# Patient Record
Sex: Female | Born: 1970 | Race: White | Hispanic: No | Marital: Married | Smoking: Current every day smoker
Health system: Southern US, Community
[De-identification: ages and names within clinical notes are randomized; demographics above are authoritative.]

## PROBLEM LIST (undated history)

## (undated) DIAGNOSIS — K469 Unspecified abdominal hernia without obstruction or gangrene: Secondary | ICD-10-CM

## (undated) DIAGNOSIS — M1990 Unspecified osteoarthritis, unspecified site: Secondary | ICD-10-CM

## (undated) DIAGNOSIS — K8020 Calculus of gallbladder without cholecystitis without obstruction: Secondary | ICD-10-CM

## (undated) DIAGNOSIS — I10 Essential (primary) hypertension: Secondary | ICD-10-CM

## (undated) HISTORY — PX: FRACTURE SURGERY: SHX138

## (undated) HISTORY — PX: JOINT REPLACEMENT: SHX530

## (undated) HISTORY — PX: NASAL FRACTURE SURGERY: SHX718

---
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
## 1990-03-06 HISTORY — PX: KNEE SURGERY: SHX244

## 2014-06-20 ENCOUNTER — Ambulatory Visit
Admit: 2014-06-20 | Disposition: A | Discharge status: Home / Self Care | Payer: Self-pay | Attending: Family Medicine | Admitting: Family Medicine

## 2015-08-11 ENCOUNTER — Other Ambulatory Visit: Payer: Self-pay | Admitting: Pediatrics

## 2015-08-11 DIAGNOSIS — N858 Other specified noninflammatory disorders of uterus: Secondary | ICD-10-CM

## 2015-08-12 ENCOUNTER — Ambulatory Visit: Payer: Self-pay

## 2015-08-13 ENCOUNTER — Other Ambulatory Visit: Payer: Self-pay | Admitting: Pediatrics

## 2015-08-13 DIAGNOSIS — N924 Excessive bleeding in the premenopausal period: Secondary | ICD-10-CM

## 2015-08-13 DIAGNOSIS — N858 Other specified noninflammatory disorders of uterus: Secondary | ICD-10-CM

## 2015-08-16 ENCOUNTER — Ambulatory Visit
Admission: RE | Admit: 2015-08-16 | Discharge: 2015-08-16 | Disposition: A | Discharge status: Home / Self Care | Payer: 59 | Source: Ambulatory Visit | Attending: Pediatrics | Admitting: Pediatrics

## 2015-08-16 DIAGNOSIS — N924 Excessive bleeding in the premenopausal period: Secondary | ICD-10-CM | POA: Diagnosis not present

## 2015-08-16 DIAGNOSIS — N83202 Unspecified ovarian cyst, left side: Secondary | ICD-10-CM | POA: Diagnosis not present

## 2015-08-16 DIAGNOSIS — N859 Noninflammatory disorder of uterus, unspecified: Secondary | ICD-10-CM | POA: Diagnosis not present

## 2015-08-16 DIAGNOSIS — N858 Other specified noninflammatory disorders of uterus: Secondary | ICD-10-CM

## 2016-05-25 ENCOUNTER — Telehealth: Payer: Self-pay | Admitting: Obstetrics & Gynecology

## 2016-05-25 NOTE — Telephone Encounter (Signed)
Pt is being referred by [facility] for Menorrhagia alternating with Missed Menses, HX ovarian Cyst on left and left LQ Pain. Unable to reach pt due to voicemail box not being set up

## 2016-05-31 NOTE — Telephone Encounter (Signed)
Unable to reach pt due to voicemail box not being set up. 2x

## 2016-06-06 NOTE — Telephone Encounter (Signed)
Attempt to schedule pt. Pt would like to calling back once she knows her schedule for work.

## 2016-08-15 ENCOUNTER — Other Ambulatory Visit: Payer: Self-pay | Admitting: Pediatrics

## 2016-08-15 DIAGNOSIS — Z1231 Encounter for screening mammogram for malignant neoplasm of breast: Secondary | ICD-10-CM

## 2016-08-15 IMAGING — US US TRANSVAGINAL NON-OB
1 series · 13 of 25 positions shown · non-contrast
Comparison: None.

CLINICAL DATA: Menorrhagia and pelvic mass on physical exam.

EXAM:
TRANSABDOMINAL AND TRANSVAGINAL ULTRASOUND OF PELVIS
TECHNIQUE: Both transabdominal and transvaginal ultrasound examinations of the
pelvis were performed. Transabdominal technique was performed for
global imaging of the pelvis including uterus, ovaries, adnexal
regions, and pelvic cul-de-sac. It was necessary to proceed with
endovaginal exam following the transabdominal exam to visualize the
uterus, endometrium, ovaries and adnexal regions..

[Series 1: us transvaginal non-ob · 0.24mm/px · 13 of 74 slices shown]
[im 1/74]
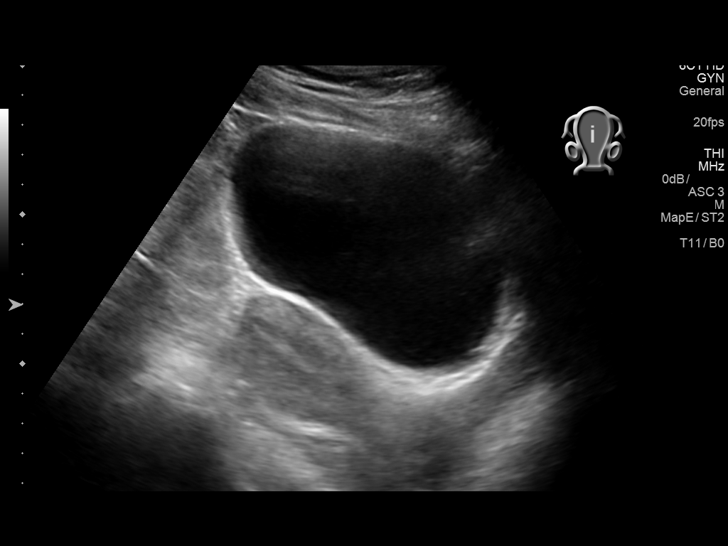
[im 7/74]
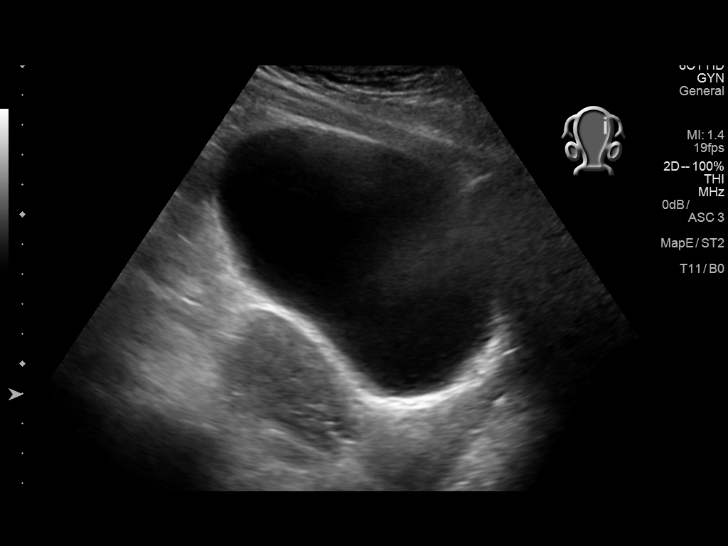
[im 13/74]
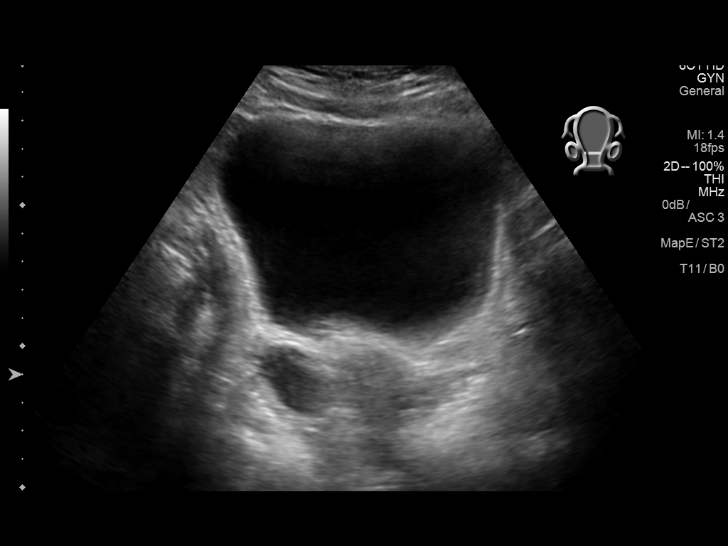
[im 19/74]
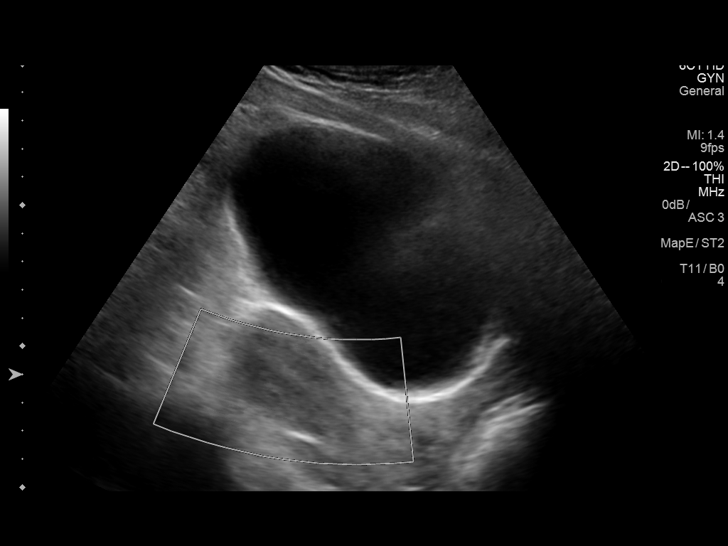
[im 25/74]
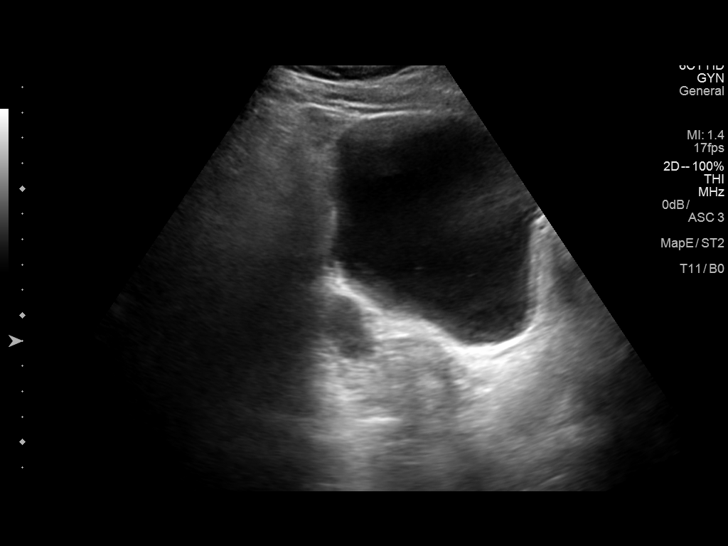
[im 31/74]
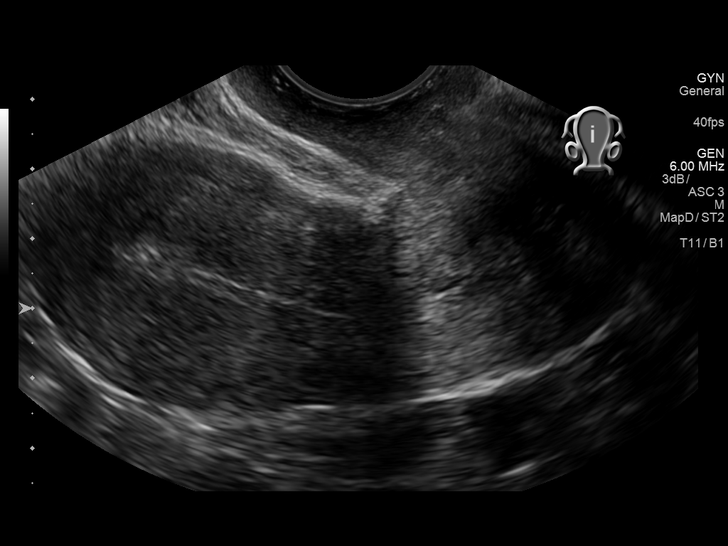
[im 37/74]
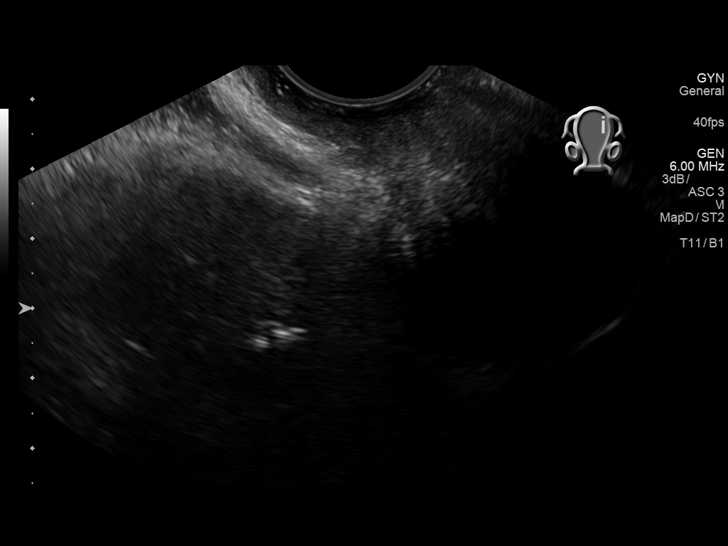
[im 43/74]
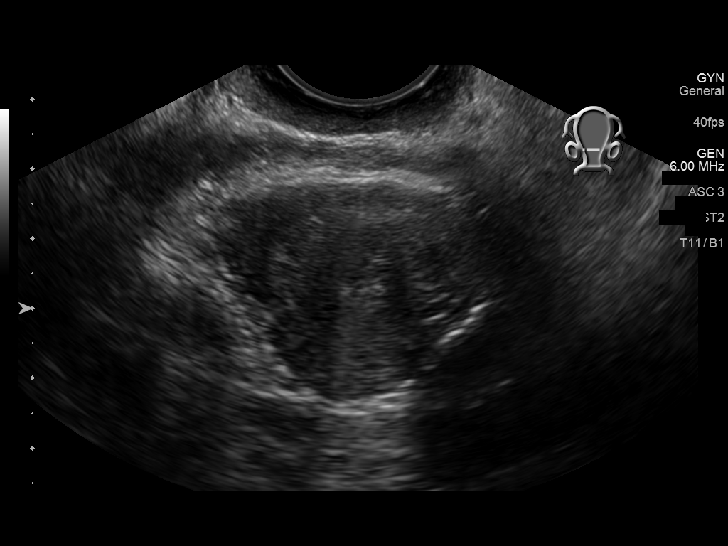
[im 49/74]
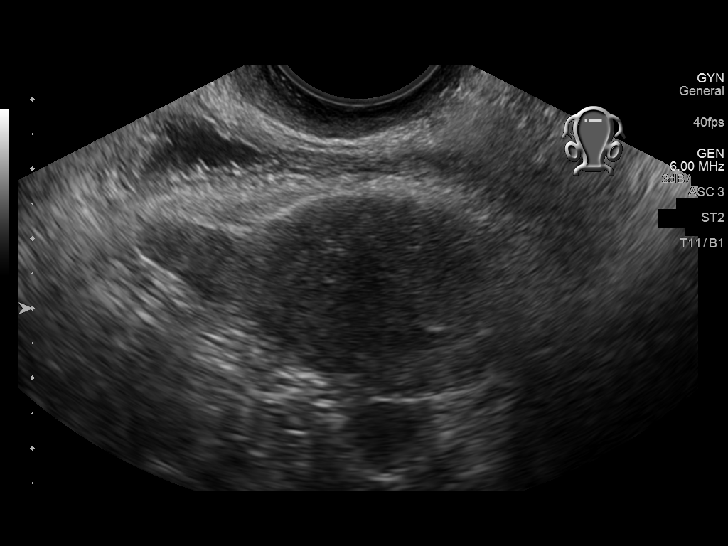
[im 55/74]
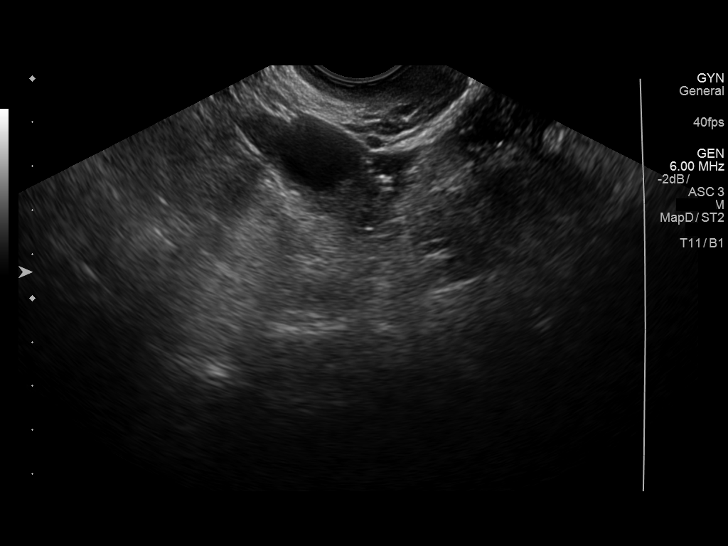
[im 61/74]
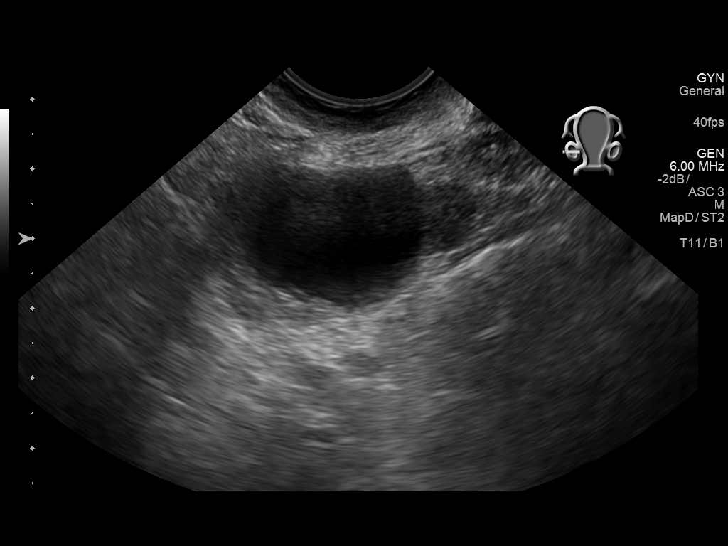
[im 67/74]
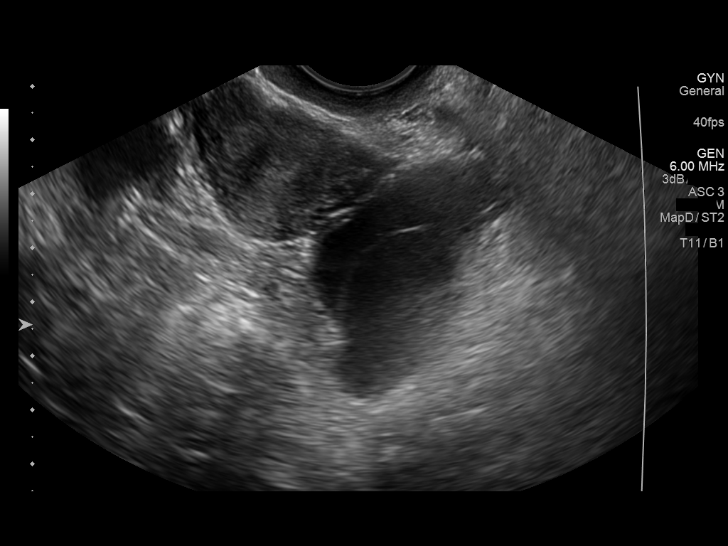
[im 74/74]
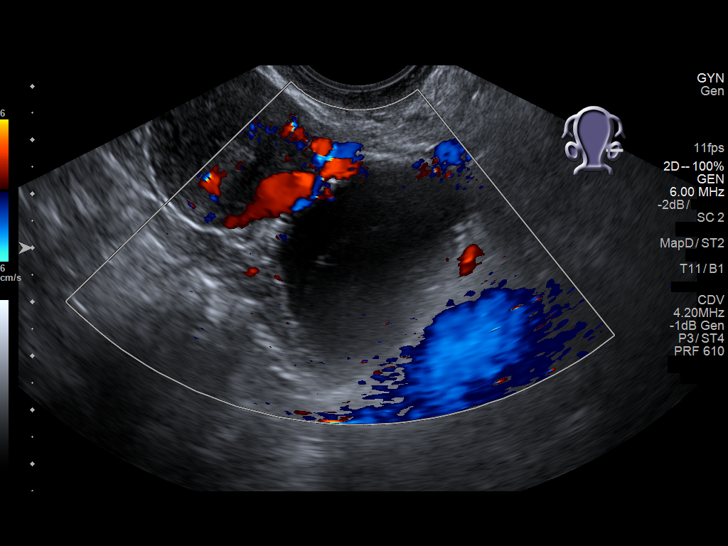

[13 of 25 positions shown; findings below may reference images not displayed]

FINDINGS: Uterus

Measurements: 8.7 x 3.8 x 4.0 cm. No fibroids or other mass
visualized.

Endometrium

Thickness: 6 mm, within normal limits. No focal abnormality
visualized.

Right ovary

Measurements: 3.5 x 2.2 x 3.6 cm. Normal appearance/no adnexal mass.

Left ovary

Measurements: 5.2 x 4.0 x 3.9 cm. An anechoic or nearly anechoic
lesion with a thin internal septation measures 4.7 x 2.9 x 2.8 cm.

Other findings

No abnormal free fluid.
IMPRESSION: Anechoic or nearly anechoic left ovarian cyst with a thin internal
septation, likely benign in a patient of this age. If further
evaluation is desired, follow-up ultrasound in 6-10 weeks could be
performed, as clinically indicated.

## 2016-08-17 ENCOUNTER — Other Ambulatory Visit: Payer: Self-pay | Admitting: Pediatrics

## 2016-08-17 ENCOUNTER — Ambulatory Visit
Admission: RE | Admit: 2016-08-17 | Discharge: 2016-08-17 | Disposition: A | Discharge status: Home / Self Care | Payer: Managed Care, Other (non HMO) | Source: Ambulatory Visit | Attending: Pediatrics | Admitting: Pediatrics

## 2016-08-17 DIAGNOSIS — Z1231 Encounter for screening mammogram for malignant neoplasm of breast: Secondary | ICD-10-CM | POA: Insufficient documentation

## 2017-02-09 ENCOUNTER — Ambulatory Visit: Payer: Self-pay | Admitting: Orthopedic Surgery

## 2017-02-13 ENCOUNTER — Encounter
Admission: RE | Admit: 2017-02-13 | Discharge: 2017-02-13 | Disposition: A | Discharge status: Home / Self Care | Payer: Managed Care, Other (non HMO) | Source: Ambulatory Visit | Attending: Orthopedic Surgery | Admitting: Orthopedic Surgery

## 2017-02-13 ENCOUNTER — Other Ambulatory Visit: Payer: Self-pay

## 2017-02-13 HISTORY — DX: Essential (primary) hypertension: I10

## 2017-02-13 HISTORY — DX: Unspecified osteoarthritis, unspecified site: M19.90

## 2017-02-13 NOTE — Patient Instructions (Addendum)
  Your procedure is scheduled on: 02-19-17 MONDAY Report to Same Day Surgery 2nd floor medical mall [facility] Entrance-take elevator on left to 2nd floor.  Check in with surgery information desk.) To find out your arrival time please call [phone] between 1PM - 3PM on 02-16-17 FRIDAY  Remember: Instructions that are not followed completely may result in serious medical risk, up to and including death, or upon the discretion of your surgeon and anesthesiologist your surgery may need to be rescheduled.    _x___ 1. Do not eat food after midnight the night before your procedure. NO GUM OR CANDY AFTER MIDNIGHT.  You may drink clear liquids up to 2 hours before you are scheduled to arrive at the hospital for your procedure.  Do not drink clear liquids within 2 hours of your scheduled arrival to the hospital.  Clear liquids include  --Water or Apple juice without pulp  --Clear carbohydrate beverage such as ClearFast or Gatorade  --Black Coffee or Clear Tea (No milk, no creamers, do not add anything to the coffee or Tea      __x__ 2. No Alcohol for 24 hours before or after surgery.   __x__3. No Smoking for 24 prior to surgery.   ____  4. Bring all medications with you on the day of surgery if instructed.    __x__ 5. Notify your doctor if there is any change in your medical condition     (cold, fever, infections).     Do not wear jewelry, make-up, hairpins, clips or nail polish.  Do not wear lotions, powders, or perfumes. You may wear deodorant.  Do not shave 48 hours prior to surgery. Men may shave face and neck.  Do not bring valuables to the hospital.    [facility] is not responsible for any belongings or valuables.               Contacts, dentures or bridgework may not be worn into surgery.  Leave your suitcase in the car. After surgery it may be brought to your room.  For patients admitted to the hospital, discharge time is determined by your treatment team.   Patients  discharged the day of surgery will not be allowed to drive home.  You will need someone to drive you home and stay with you the night of your procedure.    Please read over the following fact sheets that you were given:   [facility] Preparing for Surgery and or MRSA Information   _x___ TAKE THE FOLLOWING MEDICATION THE MORNING OF SURGERY WITH A SMALL SIP OF WATER. These include:  1. GABAPENTIN  2.  3.  4.  5.  6.  ____Fleets enema or Magnesium Citrate as directed.   _x___ Use CHG Soap or sage wipes as directed on instruction sheet   ____ Use inhalers on the day of surgery and bring to hospital day of surgery  ____ Stop Metformin and Janumet 2 days prior to surgery.    ____ Take 1/2 of usual insulin dose the night before surgery and none on the morning surgery.   ____ Follow recommendations from Cardiologist, Pulmonologist or PCP regarding stopping Aspirin, Coumadin, Plavix ,Eliquis, Effient, or Pradaxa, and Pletal.  X____Stop Anti-inflammatories such as Advil, Aleve, Ibuprofen, Motrin, Naproxen, Naprosyn, Goodies powders or aspirin products NOW-OK to take Tylenol, TRAMADOL OR HYDROCODONE IF NEEDED   _x___ Stop supplements until after surgery-STOP FISH OIL NOW-MAY RESUME AFTER SURGERY   ____ Bring C-Pap to the hospital.

## 2017-02-15 ENCOUNTER — Encounter
Admission: RE | Admit: 2017-02-15 | Discharge: 2017-02-15 | Disposition: A | Discharge status: Home / Self Care | Payer: Managed Care, Other (non HMO) | Source: Ambulatory Visit | Attending: Orthopedic Surgery | Admitting: Orthopedic Surgery

## 2017-02-15 DIAGNOSIS — Z01812 Encounter for preprocedural laboratory examination: Secondary | ICD-10-CM | POA: Insufficient documentation

## 2017-02-15 DIAGNOSIS — F17210 Nicotine dependence, cigarettes, uncomplicated: Secondary | ICD-10-CM | POA: Diagnosis not present

## 2017-02-15 DIAGNOSIS — Z01810 Encounter for preprocedural cardiovascular examination: Secondary | ICD-10-CM | POA: Diagnosis present

## 2017-02-15 DIAGNOSIS — I10 Essential (primary) hypertension: Secondary | ICD-10-CM | POA: Insufficient documentation

## 2017-02-15 LAB — POTASSIUM: Potassium: 3.8 mmol/L (ref 3.5–5.1)

## 2017-02-16 NOTE — Pre-Procedure Instructions (Signed)
Medical clearance on chart from pcp-moderate risk

## 2017-02-18 MED ORDER — CEFAZOLIN SODIUM-DEXTROSE 2-4 GM/100ML-% IV SOLN
2.0000 g | INTRAVENOUS | Status: AC
  Administered 2017-02-19: 2 g via INTRAVENOUS

## 2017-02-19 ENCOUNTER — Encounter: Payer: Self-pay | Admitting: Anesthesiology

## 2017-02-19 ENCOUNTER — Ambulatory Visit
Admission: RE | Admit: 2017-02-19 | Discharge: 2017-02-19 | Disposition: A | Discharge status: Home / Self Care | Payer: Managed Care, Other (non HMO) | Source: Ambulatory Visit | Attending: Orthopedic Surgery | Admitting: Orthopedic Surgery

## 2017-02-19 ENCOUNTER — Ambulatory Visit: Payer: Managed Care, Other (non HMO) | Admitting: Anesthesiology

## 2017-02-19 ENCOUNTER — Encounter
Admission: RE | Disposition: A | Discharge status: Home / Self Care | Payer: Self-pay | Source: Ambulatory Visit | Attending: Orthopedic Surgery

## 2017-02-19 DIAGNOSIS — Z472 Encounter for removal of internal fixation device: Secondary | ICD-10-CM | POA: Insufficient documentation

## 2017-02-19 DIAGNOSIS — I10 Essential (primary) hypertension: Secondary | ICD-10-CM | POA: Diagnosis not present

## 2017-02-19 DIAGNOSIS — M1990 Unspecified osteoarthritis, unspecified site: Secondary | ICD-10-CM | POA: Diagnosis not present

## 2017-02-19 DIAGNOSIS — Z79899 Other long term (current) drug therapy: Secondary | ICD-10-CM | POA: Insufficient documentation

## 2017-02-19 DIAGNOSIS — M79662 Pain in left lower leg: Secondary | ICD-10-CM | POA: Diagnosis not present

## 2017-02-19 DIAGNOSIS — F17200 Nicotine dependence, unspecified, uncomplicated: Secondary | ICD-10-CM | POA: Insufficient documentation

## 2017-02-19 HISTORY — PX: ORIF TIBIA PLATEAU: SHX2132

## 2017-02-19 LAB — POCT PREGNANCY, URINE: Preg Test, Ur: NEGATIVE

## 2017-02-19 SURGERY — OPEN REDUCTION INTERNAL FIXATION (ORIF) TIBIAL PLATEAU
Anesthesia: General | Site: Knee | Laterality: Left | Wound class: Clean

## 2017-02-19 MED ORDER — CHLORHEXIDINE GLUCONATE 4 % EX LIQD: 60.0000 mL | Freq: Once | CUTANEOUS | Status: DC

## 2017-02-19 MED ORDER — MIDAZOLAM HCL 2 MG/2ML IJ SOLN
INTRAMUSCULAR | Status: AC
  Filled 2017-02-19: qty 2

## 2017-02-19 MED ORDER — FAMOTIDINE 20 MG PO TABS
ORAL_TABLET | ORAL | Status: AC
  Filled 2017-02-19: qty 1

## 2017-02-19 MED ORDER — FENTANYL CITRATE (PF) 100 MCG/2ML IJ SOLN
25.0000 ug | INTRAMUSCULAR | Status: DC | PRN
Start: 2017-02-19 — End: 2017-02-19
  Administered 2017-02-19: 25 ug via INTRAVENOUS

## 2017-02-19 MED ORDER — DEXAMETHASONE SODIUM PHOSPHATE 10 MG/ML IJ SOLN
INTRAMUSCULAR | Status: AC
  Filled 2017-02-19: qty 1

## 2017-02-19 MED ORDER — BUPIVACAINE HCL (PF) 0.5 % IJ SOLN
INTRAMUSCULAR | Status: AC
  Filled 2017-02-19: qty 30

## 2017-02-19 MED ORDER — FAMOTIDINE 20 MG PO TABS
20.0000 mg | ORAL_TABLET | Freq: Once | ORAL | Status: AC
  Administered 2017-02-19: 20 mg via ORAL

## 2017-02-19 MED ORDER — HYDROMORPHONE HCL 1 MG/ML IJ SOLN
INTRAMUSCULAR | Status: DC | PRN
  Administered 2017-02-19: 1 mg via INTRAVENOUS

## 2017-02-19 MED ORDER — BUPIVACAINE-EPINEPHRINE (PF) 0.5% -1:200000 IJ SOLN
INTRAMUSCULAR | Status: AC
  Filled 2017-02-19: qty 30

## 2017-02-19 MED ORDER — ACETAMINOPHEN 500 MG PO TABS
1000.0000 mg | ORAL_TABLET | Freq: Once | ORAL | Status: AC
  Administered 2017-02-19: 1000 mg via ORAL

## 2017-02-19 MED ORDER — LACTATED RINGERS IV SOLN: INTRAVENOUS | Status: DC

## 2017-02-19 MED ORDER — ONDANSETRON HCL 4 MG/2ML IJ SOLN: 4.0000 mg | Freq: Once | INTRAMUSCULAR | Status: DC | PRN

## 2017-02-19 MED ORDER — ONDANSETRON HCL 4 MG/2ML IJ SOLN
INTRAMUSCULAR | Status: AC
  Filled 2017-02-19: qty 2

## 2017-02-19 MED ORDER — NEOMYCIN-POLYMYXIN B GU 40-200000 IR SOLN
Status: AC
  Filled 2017-02-19: qty 4

## 2017-02-19 MED ORDER — NEOMYCIN-POLYMYXIN B GU 40-200000 IR SOLN
Status: DC | PRN
  Administered 2017-02-19: 4 mL

## 2017-02-19 MED ORDER — FENTANYL CITRATE (PF) 100 MCG/2ML IJ SOLN
INTRAMUSCULAR | Status: DC | PRN
  Administered 2017-02-19: 75 ug via INTRAVENOUS
  Administered 2017-02-19: 50 ug via INTRAVENOUS
  Administered 2017-02-19: 25 ug via INTRAVENOUS
  Administered 2017-02-19: 50 ug via INTRAVENOUS

## 2017-02-19 MED ORDER — GLYCOPYRROLATE 0.2 MG/ML IJ SOLN
INTRAMUSCULAR | Status: AC
  Filled 2017-02-19: qty 1

## 2017-02-19 MED ORDER — HYDROMORPHONE HCL 1 MG/ML IJ SOLN
INTRAMUSCULAR | Status: AC
  Filled 2017-02-19: qty 1

## 2017-02-19 MED ORDER — LIDOCAINE HCL (PF) 2 % IJ SOLN
INTRAMUSCULAR | Status: AC
  Filled 2017-02-19: qty 10

## 2017-02-19 MED ORDER — PROPOFOL 10 MG/ML IV BOLUS
INTRAVENOUS | Status: DC | PRN
  Administered 2017-02-19: 50 mg via INTRAVENOUS
  Administered 2017-02-19: 150 mg via INTRAVENOUS

## 2017-02-19 MED ORDER — HYDROCODONE-ACETAMINOPHEN 5-325 MG PO TABS: 1.0000 | ORAL_TABLET | ORAL | 0 refills | Status: DC | PRN

## 2017-02-19 MED ORDER — ACETAMINOPHEN 500 MG PO TABS
ORAL_TABLET | ORAL | Status: AC
  Filled 2017-02-19: qty 2

## 2017-02-19 MED ORDER — FENTANYL CITRATE (PF) 100 MCG/2ML IJ SOLN
INTRAMUSCULAR | Status: AC
  Filled 2017-02-19: qty 2

## 2017-02-19 MED ORDER — BUPIVACAINE-EPINEPHRINE 0.5% -1:200000 IJ SOLN
INTRAMUSCULAR | Status: DC | PRN
  Administered 2017-02-19: 20 mL

## 2017-02-19 MED ORDER — GLYCOPYRROLATE 0.2 MG/ML IJ SOLN
INTRAMUSCULAR | Status: DC | PRN
  Administered 2017-02-19: 0.2 mg via INTRAVENOUS

## 2017-02-19 MED ORDER — LIDOCAINE HCL (CARDIAC) 20 MG/ML IV SOLN
INTRAVENOUS | Status: DC | PRN
  Administered 2017-02-19: 100 mg via INTRAVENOUS

## 2017-02-19 MED ORDER — PROPOFOL 10 MG/ML IV BOLUS
INTRAVENOUS | Status: AC
  Filled 2017-02-19: qty 20

## 2017-02-19 MED ORDER — DOCUSATE SODIUM 100 MG PO CAPS: 100.0000 mg | ORAL_CAPSULE | Freq: Every day | ORAL | 2 refills | Status: DC | PRN

## 2017-02-19 MED ORDER — LACTATED RINGERS IV SOLN
INTRAVENOUS | Status: DC
  Administered 2017-02-19: 12:00:00 via INTRAVENOUS

## 2017-02-19 MED ORDER — DEXAMETHASONE SODIUM PHOSPHATE 10 MG/ML IJ SOLN
INTRAMUSCULAR | Status: DC | PRN
  Administered 2017-02-19: 10 mg via INTRAVENOUS

## 2017-02-19 MED ORDER — MIDAZOLAM HCL 2 MG/2ML IJ SOLN
INTRAMUSCULAR | Status: DC | PRN
  Administered 2017-02-19: 2 mg via INTRAVENOUS

## 2017-02-19 MED ORDER — ONDANSETRON HCL 4 MG/2ML IJ SOLN
INTRAMUSCULAR | Status: DC | PRN
  Administered 2017-02-19: 4 mg via INTRAVENOUS

## 2017-02-19 SURGICAL SUPPLY — 53 items
BANDAGE ACE 6X5 VEL STRL LF (GAUZE/BANDAGES/DRESSINGS) ×3 IMPLANT
BLADE SURG SZ10 CARB STEEL (BLADE) ×3 IMPLANT
BNDG COHESIVE 4X5 TAN STRL (GAUZE/BANDAGES/DRESSINGS) ×3 IMPLANT
BNDG ESMARK 6X12 TAN STRL LF (GAUZE/BANDAGES/DRESSINGS) ×3 IMPLANT
BRUSH SCRUB EZ  4% CHG (MISCELLANEOUS) ×2
BRUSH SCRUB EZ 4% CHG (MISCELLANEOUS) ×1 IMPLANT
CANISTER SUCT 1200ML W/VALVE (MISCELLANEOUS) ×3 IMPLANT
CHLORAPREP W/TINT 26ML (MISCELLANEOUS) ×3 IMPLANT
CNTNR SPEC 2.5X3XGRAD LEK (MISCELLANEOUS) ×1
CONT SPEC 4OZ STER OR WHT (MISCELLANEOUS) ×2
CONTAINER SPEC 2.5X3XGRAD LEK (MISCELLANEOUS) ×1 IMPLANT
COOLER POLAR GLACIER W/PUMP (MISCELLANEOUS) ×3 IMPLANT
CUFF TOURN 24 STER (MISCELLANEOUS) IMPLANT
CUFF TOURN 30 STER DUAL PORT (MISCELLANEOUS) ×3 IMPLANT
DRAPE C-ARM XRAY 36X54 (DRAPES) ×3 IMPLANT
DRAPE C-ARMOR (DRAPES) ×3 IMPLANT
DRAPE INCISE IOBAN 66X45 STRL (DRAPES) ×3 IMPLANT
DRAPE TABLE BACK 80X90 (DRAPES) ×3 IMPLANT
DRAPE U-SHAPE 47X51 STRL (DRAPES) ×3 IMPLANT
DRSG AQUACEL AG ADV 3.5X10 (GAUZE/BANDAGES/DRESSINGS) ×3 IMPLANT
ELECT REM PT RETURN 9FT ADLT (ELECTROSURGICAL) ×3
ELECTRODE REM PT RTRN 9FT ADLT (ELECTROSURGICAL) ×1 IMPLANT
GAUZE PETRO XEROFOAM 1X8 (MISCELLANEOUS) ×3 IMPLANT
GAUZE SPONGE 4X4 12PLY STRL (GAUZE/BANDAGES/DRESSINGS) ×3 IMPLANT
GAUZE XEROFORM 4X4 STRL (GAUZE/BANDAGES/DRESSINGS) ×3 IMPLANT
GLOVE BIO SURGEON STRL SZ7.5 (GLOVE) ×6 IMPLANT
GLOVE INDICATOR 8.0 STRL GRN (GLOVE) ×3 IMPLANT
GLOVE SURG ORTHO 8.0 STRL STRW (GLOVE) ×3 IMPLANT
GOWN STRL REUS W/ TWL LRG LVL3 (GOWN DISPOSABLE) ×1 IMPLANT
GOWN STRL REUS W/ TWL XL LVL3 (GOWN DISPOSABLE) ×1 IMPLANT
GOWN STRL REUS W/TWL LRG LVL3 (GOWN DISPOSABLE) ×2
GOWN STRL REUS W/TWL XL LVL3 (GOWN DISPOSABLE) ×2
IMMBOLIZER KNEE 19 BLUE UNIV (SOFTGOODS) ×3 IMPLANT
KIT RM TURNOVER STRD PROC AR (KITS) ×3 IMPLANT
NS IRRIG 1000ML POUR BTL (IV SOLUTION) ×3 IMPLANT
PACK EXTREMITY ARMC (MISCELLANEOUS) ×3 IMPLANT
PAD ABD DERMACEA PRESS 5X9 (GAUZE/BANDAGES/DRESSINGS) ×3 IMPLANT
PAD CAST CTTN 4X4 STRL (SOFTGOODS) ×1 IMPLANT
PAD PREP 24X41 OB/GYN DISP (PERSONAL CARE ITEMS) ×3 IMPLANT
PAD WRAPON POLAR KNEE (MISCELLANEOUS) ×1 IMPLANT
PADDING CAST BLEND 6X4 STRL (MISCELLANEOUS) ×1 IMPLANT
PADDING CAST COTTON 4X4 STRL (SOFTGOODS) ×2
PADDING STRL CAST 6IN (MISCELLANEOUS) ×2
SPONGE LAP 18X18 5 PK (GAUZE/BANDAGES/DRESSINGS) ×3 IMPLANT
STAPLER SKIN PROX 35W (STAPLE) ×3 IMPLANT
STOCKINETTE BIAS CUT 6 980064 (GAUZE/BANDAGES/DRESSINGS) ×3 IMPLANT
STOCKINETTE IMPERVIOUS 9X36 MD (GAUZE/BANDAGES/DRESSINGS) ×3 IMPLANT
SUT VIC AB 0 CT1 36 (SUTURE) ×6 IMPLANT
SUT VIC AB 2-0 CT1 27 (SUTURE) ×4
SUT VIC AB 2-0 CT1 TAPERPNT 27 (SUTURE) ×2 IMPLANT
SUT VICRYL+ 3-0 36IN CT-1 (SUTURE) IMPLANT
SYR 5ML 18GX1 1/2 (NEEDLE) ×3 IMPLANT
WRAPON POLAR PAD KNEE (MISCELLANEOUS) ×3

## 2017-02-19 NOTE — Anesthesia Post-op Follow-up Note (Signed)
Anesthesia QCDR form completed.        

## 2017-02-19 NOTE — Op Note (Signed)
  02/19/2017  2:06 PM  PATIENT:  [name]    PRE-OPERATIVE DIAGNOSIS:  T84.84XA Pain Due to internal orthopedic prosth dev/grft, init, left tibia  POST-OPERATIVE DIAGNOSIS:  Same  PROCEDURE:  HARDWARE REMOVAL TIBIAL, DEEP, LEFT TIBIA  SURGEON:  Lovell Sheehan, MD  ANESTHESIA:   General  PREOPERATIVE INDICATIONS:  Yvonne Nelson is a  46 y.o. female with a diagnosis of T84.84XA Pain Due to internal orthopedic prosth dev/grft, init who  elected for surgical management of the fracture  The risks benefits and alternatives were discussed with the patient preoperatively including but not limited to the risks of infection, bleeding, nerve injury, cardiopulmonary complications, the need for revision surgery, among others, and the patient was willing to proceed.  EBL: less than 25 CC  TOURNIQUET TIME: 42 MIN at 250 mmHg  OPERATIVE IMPLANTS: Plate and 6 screws removed  OPERATIVE FINDINGS: lateral tibial plate with bone overgrowth and severe valgus deformity and flexion contracture of the left knee  OPERATIVE PROCEDURE:    Patient was seen in the preoperative area. I marked the operative leg according tl the hospital's correct site of surgery protocol. Patient was then brought to the operating room and was placed supine on the operative table and underwent general anesthesia with an LMA.   The operative leg was prepped and draped in a sterile fashion. A timeout performed to verify the patient's name, date of birth, medical record number, correct site of surgery correct procedure to be performed. The timeout was also used a timeout to verify patient received antibiotics and appropriate instruments, implants and radiographs studies were available in the room. Once all in attendance were in agreement case began.   Patient then had the operative extremity exsanguinated with an Esmarch. The tourniquet was placed on the upper extremity and inflated 250 mm.  The prior  incision was used and the fascia opened sharply to expose the lateral tibial plate. Using osteotomes the bony overgrowth was debrided. The screw heads were isolated and removed. The plate was elevated from the bone and passed from the field. The wound again was copiously irrigated. The deep fascia was closed with 0 Vicryl and 2-0 Vicryl for the subcutaneous tissues. The tissues were infiltrated with 1/4% marcaine with epinephrine.  The skin was closed with staples. Xeroform and a dry sterile dressing were applied along with a knee immobilizer. I was scrubbed and present for the entire case and all sharp and instrument counts were correct at the conclusion the case. The patient tolerated this procedure well and was awakened and taken to the recovery room in good condition.   Elyn Aquas. Harlow Mares, MD

## 2017-02-19 NOTE — OR Nursing (Signed)
Explanted one plate and six screws from left tibia. Per MD explanted implants to be given to patient. Consent to be obtained by MD prior to giving the explants.  Explanted implants to be given to patient per policy.

## 2017-02-19 NOTE — Transfer of Care (Signed)
Immediate Anesthesia Transfer of Care Note  Patient: Yvonne Nelson  Procedure(s) Performed: HARDWARE REMOVAL TIBIAL (Left Knee)  Patient Location: PACU  Anesthesia Type:General  Level of Consciousness: awake, alert  and oriented  Airway & Oxygen Therapy: Patient Spontanous Breathing  Post-op Assessment: Report given to RN and Post -op Vital signs reviewed and stable  Post vital signs: Reviewed and stable  Last Vitals:  Vitals:   02/19/17 1113 02/19/17 1356  BP: (!) 160/96 (!) 158/95  Pulse: 78 86  Resp: 17 12  Temp: 36.8 C 36.6 C  SpO2: 100% 100%    Last Pain:  Vitals:   02/19/17 1356  TempSrc: Tympanic         Complications: No apparent anesthesia complications

## 2017-02-19 NOTE — Discharge Instructions (Signed)
May put half of your weight on the left leg while in the knee immobilizer Apply ice and elevate, OK to remove knee immobilizer at night Keep incision clean and dry Follow-up as scheduled with Dr. Harlow Mares, call [phone] with any questions or problems such as fever, drainage or shortness of breath

## 2017-02-19 NOTE — Anesthesia Procedure Notes (Signed)
Procedure Name: LMA Insertion Date/Time: 02/19/2017 12:19 PM Performed by: Johnna Acosta, CRNA Pre-anesthesia Checklist: Patient identified, Emergency Drugs available, Suction available, Patient being monitored and Timeout performed Patient Re-evaluated:Patient Re-evaluated prior to induction Oxygen Delivery Method: Circle system utilized Preoxygenation: Pre-oxygenation with 100% oxygen Induction Type: IV induction Ventilation: Mask ventilation without difficulty LMA: LMA inserted LMA Size: 5.0 Tube type: Oral Number of attempts: 1 Placement Confirmation: positive ETCO2 and breath sounds checked- equal and bilateral Tube secured with: Tape Dental Injury: Teeth and Oropharynx as per pre-operative assessment

## 2017-02-19 NOTE — H&P (Signed)
The patient has been re-examined, and the chart reviewed, and there have been no interval changes to the documented history and physical.  Plan a left knee removal of lateral tibial plateau plate today.  Anesthesia is consulted regarding a peripheral nerve block for post-operative pain.  The risks, benefits, and alternatives have been discussed at length, and the patient is willing to proceed.

## 2017-02-19 NOTE — Anesthesia Postprocedure Evaluation (Signed)
Anesthesia Post Note  Patient: Yvonne Nelson  Procedure(s) Performed: HARDWARE REMOVAL TIBIAL (Left Knee)  Patient location during evaluation: PACU Anesthesia Type: General Level of consciousness: awake and alert Pain management: pain level controlled Vital Signs Assessment: post-procedure vital signs reviewed and stable Respiratory status: spontaneous breathing, nonlabored ventilation, respiratory function stable and patient connected to nasal cannula oxygen Cardiovascular status: blood pressure returned to baseline and stable Postop Assessment: no apparent nausea or vomiting Anesthetic complications: no     Last Vitals:  Vitals:   02/19/17 1358 02/19/17 1415  BP: (!) 158/95 (!) 169/83  Pulse: 82 84  Resp: 16 19  Temp: 36.6 C   SpO2: 100% 96%    Last Pain:  Vitals:   02/19/17 1420  TempSrc:   PainSc: [place]

## 2017-02-19 NOTE — Anesthesia Preprocedure Evaluation (Addendum)
Anesthesia Evaluation  Patient identified by MRN, date of birth, ID band Patient awake    Reviewed: Allergy & Precautions, NPO status , Patient's Chart, lab work & pertinent test results, reviewed documented beta blocker date and time   Airway Mallampati: II  TM Distance: >3 FB     Dental  (+) Chipped, Missing   Pulmonary Current Smoker,           Cardiovascular hypertension, Pt. on medications      Neuro/Psych    GI/Hepatic   Endo/Other    Renal/GU      Musculoskeletal  (+) Arthritis ,   Abdominal   Peds  Hematology   Anesthesia Other Findings   Reproductive/Obstetrics                            Anesthesia Physical Anesthesia Plan  ASA: II  Anesthesia Plan: General   Post-op Pain Management:    Induction: Intravenous  PONV Risk Score and Plan:   Airway Management Planned: LMA  Additional Equipment:   Intra-op Plan:   Post-operative Plan:   Informed Consent: I have reviewed the patients History and Physical, chart, labs and discussed the procedure including the risks, benefits and alternatives for the proposed anesthesia with the patient or authorized representative who has indicated his/her understanding and acceptance.     Plan Discussed with: CRNA  Anesthesia Plan Comments:         Anesthesia Quick Evaluation

## 2017-04-17 ENCOUNTER — Other Ambulatory Visit: Payer: Self-pay | Admitting: Orthopedic Surgery

## 2017-04-17 DIAGNOSIS — M1732 Unilateral post-traumatic osteoarthritis, left knee: Secondary | ICD-10-CM

## 2017-05-04 ENCOUNTER — Ambulatory Visit: Payer: BLUE CROSS/BLUE SHIELD

## 2017-05-11 ENCOUNTER — Ambulatory Visit
Admission: RE | Admit: 2017-05-11 | Discharge: 2017-05-11 | Disposition: A | Discharge status: Home / Self Care | Payer: BLUE CROSS/BLUE SHIELD | Source: Ambulatory Visit | Attending: Orthopedic Surgery | Admitting: Orthopedic Surgery

## 2017-05-11 DIAGNOSIS — M1732 Unilateral post-traumatic osteoarthritis, left knee: Secondary | ICD-10-CM | POA: Insufficient documentation

## 2017-05-11 DIAGNOSIS — M2342 Loose body in knee, left knee: Secondary | ICD-10-CM | POA: Diagnosis not present

## 2017-05-11 DIAGNOSIS — Z9889 Other specified postprocedural states: Secondary | ICD-10-CM | POA: Diagnosis not present

## 2017-07-13 ENCOUNTER — Ambulatory Visit: Payer: Self-pay | Admitting: Orthopedic Surgery

## 2017-08-08 ENCOUNTER — Encounter
Admission: RE | Admit: 2017-08-08 | Discharge: 2017-08-08 | Disposition: A | Discharge status: Home / Self Care | Payer: BLUE CROSS/BLUE SHIELD | Source: Ambulatory Visit | Attending: Orthopedic Surgery | Admitting: Orthopedic Surgery

## 2017-08-08 ENCOUNTER — Other Ambulatory Visit: Payer: Self-pay

## 2017-08-08 DIAGNOSIS — Z01812 Encounter for preprocedural laboratory examination: Secondary | ICD-10-CM | POA: Diagnosis not present

## 2017-08-08 HISTORY — DX: Calculus of gallbladder without cholecystitis without obstruction: K80.20

## 2017-08-08 HISTORY — DX: Unspecified abdominal hernia without obstruction or gangrene: K46.9

## 2017-08-08 LAB — URINALYSIS, ROUTINE W REFLEX MICROSCOPIC
BACTERIA UA: NONE SEEN
BILIRUBIN URINE: NEGATIVE
Glucose, UA: NEGATIVE mg/dL
Ketones, ur: 5 mg/dL — AB
Leukocytes, UA: NEGATIVE
Nitrite: NEGATIVE
Protein, ur: NEGATIVE mg/dL
Specific Gravity, Urine: 1.021 (ref 1.005–1.030)
pH: 5 (ref 5.0–8.0)

## 2017-08-08 LAB — CBC
HCT: 47.7 % — ABNORMAL HIGH (ref 35.0–47.0)
HEMOGLOBIN: 16.5 g/dL — AB (ref 12.0–16.0)
MCH: 31.9 pg (ref 26.0–34.0)
MCHC: 34.5 g/dL (ref 32.0–36.0)
MCV: 92.4 fL (ref 80.0–100.0)
PLATELETS: 245 10*3/uL (ref 150–440)
RBC: 5.16 MIL/uL (ref 3.80–5.20)
RDW: 13.4 % (ref 11.5–14.5)
WBC: 9.6 10*3/uL (ref 3.6–11.0)

## 2017-08-08 LAB — BASIC METABOLIC PANEL
ANION GAP: 13 (ref 5–15)
BUN: 9 mg/dL (ref 6–20)
CALCIUM: 9.2 mg/dL (ref 8.9–10.3)
CO2: 22 mmol/L (ref 22–32)
CREATININE: 0.6 mg/dL (ref 0.44–1.00)
Chloride: 101 mmol/L (ref 101–111)
GFR calc Af Amer: >60 mL/min (ref >60)
GFR calc non Af Amer: >60 mL/min (ref >60)
Glucose, Bld: 103 mg/dL — ABNORMAL HIGH (ref 65–99)
Potassium: 3.8 mmol/L (ref 3.5–5.1)
Sodium: 136 mmol/L (ref 135–145)

## 2017-08-08 LAB — PROTIME-INR
INR: 0.87
PROTHROMBIN TIME: 11.7 s (ref 11.4–15.2)

## 2017-08-08 LAB — TYPE AND SCREEN
ABO/RH(D): O POS
Antibody Screen: NEGATIVE

## 2017-08-08 LAB — SURGICAL PCR SCREEN
MRSA, PCR: NEGATIVE
STAPHYLOCOCCUS AUREUS: POSITIVE — AB

## 2017-08-08 LAB — APTT: APTT: 29 s (ref 24–36)

## 2017-08-08 NOTE — Pre-Procedure Instructions (Signed)
Written and verbal instructions given on use of incentive spirometer; acknowledged understanding. Patient states she still has one at home from previous surgery 6 months ago.

## 2017-08-08 NOTE — Patient Instructions (Signed)
Your procedure is scheduled on: Monday, August 20, 2017 Report to Day Surgery on the 2nd floor of the [facility]. To find out your arrival time, please call [phone] between 1PM - 3PM on: Friday, August 17, 2017  REMEMBER: Instructions that are not followed completely may result in serious medical risk, up to and including death; or upon the discretion of your surgeon and anesthesiologist your surgery may need to be rescheduled.  Do not eat food after midnight the night before your procedure.  No gum chewing, lozengers or hard candies.  You may however, drink CLEAR liquids up to 2 hours before you are scheduled to arrive for your surgery. Do not drink anything within 2 hours of the start of your surgery.  Clear liquids include: - water  - apple juice without pulp - clear gatorade - black coffee or tea (Do NOT add anything to the coffee or tea) Do NOT drink anything that is not on this list.  No Alcohol for 24 hours before or after surgery.  No Smoking including e-cigarettes for 24 hours prior to surgery.  No chewable tobacco products for at least 6 hours prior to surgery.  No nicotine patches on the day of surgery.  On the morning of surgery brush your teeth with toothpaste and water, you may rinse your mouth with mouthwash if you wish. Do not swallow any toothpaste or mouthwash.  Notify your doctor if there is any change in your medical condition (cold, fever, infection).  Do not wear jewelry, make-up, hairpins, clips or nail polish.  Do not wear lotions, powders, or perfumes. You may wear deodorant.  Do not shave 48 hours prior to surgery.   Contacts and dentures may not be worn into surgery.  Do not bring valuables to the hospital, including drivers license, insurance or credit cards.  [facility] is not responsible for any belongings or valuables.   TAKE THESE MEDICATIONS THE MORNING OF SURGERY:  1.  TRAMADOL (if needed for pain)  Use CHG Soap as directed on  instruction sheet.  On June 10 - Stop Anti-inflammatories (NSAIDS) such as Advil, Aleve, Ibuprofen, Motrin, Naproxen, Naprosyn and Aspirin based products such as Excedrin, Goodys Powder, BC Powder. (May take Tylenol or Acetaminophen if needed.)  On June 10 - Stop ANY OVER THE COUNTER supplements until after surgery. (Fish Oil) (May continue multivitamin.)  Wear comfortable clothing (specific to your surgery type) to the hospital.  Plan for stool softeners for home use.  If you are being admitted to the hospital overnight, leave your suitcase in the car. After surgery it may be brought to your room.  Please call [phone] if you have any questions about these instructions.

## 2017-08-19 MED ORDER — TRANEXAMIC ACID 1000 MG/10ML IV SOLN
1000.0000 mg | INTRAVENOUS | Status: DC
  Filled 2017-08-19: qty 10

## 2017-08-19 MED ORDER — CEFAZOLIN SODIUM-DEXTROSE 2-4 GM/100ML-% IV SOLN: 2.0000 g | INTRAVENOUS | Status: DC

## 2017-08-20 ENCOUNTER — Encounter
Admission: RE | Disposition: A | Discharge status: Home / Self Care | Payer: Self-pay | Source: Ambulatory Visit | Attending: Orthopedic Surgery

## 2017-08-20 ENCOUNTER — Encounter: Payer: Self-pay | Admitting: Anesthesiology

## 2017-08-20 ENCOUNTER — Inpatient Hospital Stay: Payer: BLUE CROSS/BLUE SHIELD | Admitting: Anesthesiology

## 2017-08-20 ENCOUNTER — Ambulatory Visit
Admission: RE | Admit: 2017-08-20 | Discharge: 2017-08-20 | Disposition: A | Discharge status: Home / Self Care | Payer: BLUE CROSS/BLUE SHIELD | Source: Ambulatory Visit | Attending: Orthopedic Surgery | Admitting: Orthopedic Surgery

## 2017-08-20 DIAGNOSIS — M1712 Unilateral primary osteoarthritis, left knee: Secondary | ICD-10-CM | POA: Diagnosis present

## 2017-08-20 DIAGNOSIS — Z539 Procedure and treatment not carried out, unspecified reason: Secondary | ICD-10-CM | POA: Diagnosis present

## 2017-08-20 LAB — ABO/RH: ABO/RH(D): O POS

## 2017-08-20 LAB — POCT PREGNANCY, URINE: Preg Test, Ur: NEGATIVE

## 2017-08-20 SURGERY — ARTHROPLASTY, KNEE, TOTAL
Anesthesia: Spinal | Laterality: Left

## 2017-08-20 MED ORDER — ACETAMINOPHEN 500 MG PO TABS: 1000.0000 mg | ORAL_TABLET | Freq: Once | ORAL | Status: DC

## 2017-08-20 MED ORDER — ACETAMINOPHEN 500 MG PO TABS
ORAL_TABLET | ORAL | Status: AC
  Filled 2017-08-20: qty 2

## 2017-08-20 MED ORDER — MIDAZOLAM HCL 2 MG/2ML IJ SOLN
INTRAMUSCULAR | Status: AC
  Filled 2017-08-20: qty 2

## 2017-08-20 MED ORDER — LIDOCAINE HCL (PF) 1 % IJ SOLN
INTRAMUSCULAR | Status: AC
  Filled 2017-08-20: qty 2

## 2017-08-20 MED ORDER — FAMOTIDINE 20 MG PO TABS
ORAL_TABLET | ORAL | Status: AC
  Administered 2017-08-20: 20 mg via ORAL
  Filled 2017-08-20: qty 1

## 2017-08-20 MED ORDER — BUPIVACAINE HCL (PF) 0.5 % IJ SOLN
INTRAMUSCULAR | Status: AC
  Filled 2017-08-20: qty 10

## 2017-08-20 MED ORDER — FENTANYL CITRATE (PF) 100 MCG/2ML IJ SOLN
INTRAMUSCULAR | Status: AC
  Filled 2017-08-20: qty 2

## 2017-08-20 MED ORDER — CHLORHEXIDINE GLUCONATE 4 % EX LIQD: 60.0000 mL | Freq: Once | CUTANEOUS | Status: DC

## 2017-08-20 MED ORDER — BUPIVACAINE LIPOSOME 1.3 % IJ SUSP
20.0000 mL | Freq: Once | INTRAMUSCULAR | Status: DC
  Filled 2017-08-20: qty 20

## 2017-08-20 MED ORDER — FAMOTIDINE 20 MG PO TABS
20.0000 mg | ORAL_TABLET | Freq: Once | ORAL | Status: AC
  Administered 2017-08-20: 20 mg via ORAL

## 2017-08-20 MED ORDER — LIDOCAINE HCL (PF) 2 % IJ SOLN
INTRAMUSCULAR | Status: AC
  Filled 2017-08-20: qty 10

## 2017-08-20 MED ORDER — PROPOFOL 500 MG/50ML IV EMUL
INTRAVENOUS | Status: AC
  Filled 2017-08-20: qty 50

## 2017-08-20 MED ORDER — LACTATED RINGERS IV SOLN: INTRAVENOUS | Status: DC

## 2017-08-20 MED ORDER — CEFAZOLIN SODIUM-DEXTROSE 2-4 GM/100ML-% IV SOLN
INTRAVENOUS | Status: AC
  Filled 2017-08-20: qty 100

## 2017-08-20 SURGICAL SUPPLY — 49 items
BLADE SAW 1 (BLADE) ×3 IMPLANT
BLADE SAW 1/2 (BLADE) ×3 IMPLANT
BLADE SAW 18WX90L 1.27 THK (BLADE) ×3 IMPLANT
BOWL CEMENT MIX W/ADAPTER (MISCELLANEOUS) ×3 IMPLANT
BRUSH SCRUB EZ  4% CHG (MISCELLANEOUS) ×4
BRUSH SCRUB EZ 4% CHG (MISCELLANEOUS) ×2 IMPLANT
CANISTER SUCT 1200ML W/VALVE (MISCELLANEOUS) ×3 IMPLANT
CANISTER SUCT 3000ML PPV (MISCELLANEOUS) ×6 IMPLANT
CHLORAPREP W/TINT 26ML (MISCELLANEOUS) ×6 IMPLANT
COOLER POLAR GLACIER W/PUMP (MISCELLANEOUS) ×3 IMPLANT
CUFF TOURN 30 STER DUAL PORT (MISCELLANEOUS) ×3 IMPLANT
DRAPE INCISE IOBAN 66X60 STRL (DRAPES) ×3 IMPLANT
DRAPE SHEET LG 3/4 BI-LAMINATE (DRAPES) ×3 IMPLANT
DRSG AQUACEL AG ADV 3.5X14 (GAUZE/BANDAGES/DRESSINGS) ×3 IMPLANT
ELECT REM PT RETURN 9FT ADLT (ELECTROSURGICAL) ×3
ELECTRODE REM PT RTRN 9FT ADLT (ELECTROSURGICAL) ×1 IMPLANT
GAUZE PETRO XEROFOAM 1X8 (MISCELLANEOUS) ×3 IMPLANT
GLOVE INDICATOR 8.0 STRL GRN (GLOVE) ×3 IMPLANT
GLOVE SURG ORTHO 8.0 STRL STRW (GLOVE) ×9 IMPLANT
GOWN STRL REUS W/ TWL LRG LVL3 (GOWN DISPOSABLE) ×1 IMPLANT
GOWN STRL REUS W/ TWL XL LVL3 (GOWN DISPOSABLE) ×1 IMPLANT
GOWN STRL REUS W/TWL LRG LVL3 (GOWN DISPOSABLE) ×2
GOWN STRL REUS W/TWL XL LVL3 (GOWN DISPOSABLE) ×2
HOOD PEEL AWAY FLYTE STAYCOOL (MISCELLANEOUS) ×9 IMPLANT
IV NS 1000ML (IV SOLUTION) ×2
IV NS 1000ML BAXH (IV SOLUTION) ×1 IMPLANT
KIT TURNOVER KIT A (KITS) ×3 IMPLANT
MAT ABSORB  FLUID 56X50 GRAY (MISCELLANEOUS) ×2
MAT ABSORB FLUID 56X50 GRAY (MISCELLANEOUS) ×1 IMPLANT
NDL SAFETY ECLIPSE 18X1.5 (NEEDLE) ×1 IMPLANT
NEEDLE HYPO 18GX1.5 SHARP (NEEDLE) ×2
NEEDLE SPNL 20GX3.5 QUINCKE YW (NEEDLE) ×3 IMPLANT
NS IRRIG 1000ML POUR BTL (IV SOLUTION) ×3 IMPLANT
PACK TOTAL KNEE (MISCELLANEOUS) ×3 IMPLANT
PAD DE MAYO PRESSURE PROTECT (MISCELLANEOUS) ×3 IMPLANT
PAD WRAPON POLAR KNEE (MISCELLANEOUS) ×1 IMPLANT
PULSAVAC PLUS IRRIG FAN TIP (DISPOSABLE) ×3
STAPLER SKIN PROX 35W (STAPLE) ×3 IMPLANT
SUCTION FRAZIER HANDLE 10FR (MISCELLANEOUS) ×2
SUCTION TUBE FRAZIER 10FR DISP (MISCELLANEOUS) ×1 IMPLANT
SUT DVC 2 QUILL PDO  T11 36X36 (SUTURE) ×2
SUT DVC 2 QUILL PDO T11 36X36 (SUTURE) ×1 IMPLANT
SUT VIC AB 2-0 CT1 18 (SUTURE) ×3 IMPLANT
SUT VIC AB 2-0 CT1 27 (SUTURE) ×2
SUT VIC AB 2-0 CT1 TAPERPNT 27 (SUTURE) ×1 IMPLANT
SUT VIC AB PLUS 45CM 1-MO-4 (SUTURE) ×3 IMPLANT
SYR 30ML LL (SYRINGE) ×9 IMPLANT
TIP FAN IRRIG PULSAVAC PLUS (DISPOSABLE) ×1 IMPLANT
WRAPON POLAR PAD KNEE (MISCELLANEOUS) ×3

## 2017-08-20 NOTE — Anesthesia Preprocedure Evaluation (Addendum)
Anesthesia Evaluation  Patient identified by MRN, date of birth, ID band Patient awake    Reviewed: Allergy & Precautions, NPO status , Patient's Chart, lab work & pertinent test results, reviewed documented beta blocker date and time   Airway Mallampati: III  TM Distance: <3 FB     Dental  (+) Chipped, Missing   Pulmonary Current Smoker,    Pulmonary exam normal        Cardiovascular hypertension, Pt. on medications Normal cardiovascular exam     Neuro/Psych negative neurological ROS  negative psych ROS   GI/Hepatic Neg liver ROS,   Endo/Other  negative endocrine ROS  Renal/GU negative Renal ROS  negative genitourinary   Musculoskeletal  (+) Arthritis ,   Abdominal Normal abdominal exam  (+)   Peds negative pediatric ROS (+)  Hematology negative hematology ROS (+)   Anesthesia Other Findings Past Medical History: No date: Arthritis     Comment:  knee  No date: Gallstones No date: Hypertension No date: Infantile hernia  Reproductive/Obstetrics                            Anesthesia Physical  Anesthesia Plan  ASA: II  Anesthesia Plan: Spinal   Post-op Pain Management:    Induction: Intravenous  PONV Risk Score and Plan:   Airway Management Planned: Nasal Cannula  Additional Equipment:   Intra-op Plan:   Post-operative Plan:   Informed Consent: I have reviewed the patients History and Physical, chart, labs and discussed the procedure including the risks, benefits and alternatives for the proposed anesthesia with the patient or authorized representative who has indicated his/her understanding and acceptance.   Dental advisory given  Plan Discussed with: CRNA and Surgeon  Anesthesia Plan Comments: (Called by preop testing nurses to try to get IV access after they have tried.  Little access points noted and peripheral sites left would not thread.  [place] used to access  compressible vein in R antecubital region successful but infiltrated secondary to not enough angiocath to stay in vein as patient straightened arm.  Will schedule patient to get a picc line and reschedule surgery.)       Anesthesia Quick Evaluation

## 2017-08-20 NOTE — H&P (Signed)
The patient has been re-examined, and the chart reviewed, and there have been no interval changes to the documented history and physical.  Plan a left total knee replacement today.  Anesthesia is consulted regarding a peripheral nerve block for post-operative pain.  The risks, benefits, and alternatives have been discussed at length, and the patient is willing to proceed.     

## 2017-08-20 NOTE — OR Nursing (Signed)
After multiple IV sticks from anesthesia and nursing. It was decided that pt. Should be scheduled for an out pt. picc line and rescheduled for surgery. Discharged to home.

## 2017-08-23 NOTE — H&P (Signed)
No H&P required. Her surgery was cancelled. Thank you.

## 2018-10-18 ENCOUNTER — Encounter: Payer: Self-pay | Admitting: Emergency Medicine

## 2018-10-18 ENCOUNTER — Emergency Department
Admission: EM | Admit: 2018-10-18 | Discharge: 2018-10-18 | Disposition: A | Discharge status: Home / Self Care | Payer: No Typology Code available for payment source | Attending: Emergency Medicine | Admitting: Emergency Medicine

## 2018-10-18 ENCOUNTER — Other Ambulatory Visit: Payer: Self-pay

## 2018-10-18 DIAGNOSIS — Z23 Encounter for immunization: Secondary | ICD-10-CM | POA: Diagnosis not present

## 2018-10-18 DIAGNOSIS — Y990 Civilian activity done for income or pay: Secondary | ICD-10-CM | POA: Insufficient documentation

## 2018-10-18 DIAGNOSIS — S60457A Superficial foreign body of left little finger, initial encounter: Secondary | ICD-10-CM | POA: Insufficient documentation

## 2018-10-18 DIAGNOSIS — W268XXA Contact with other sharp object(s), not elsewhere classified, initial encounter: Secondary | ICD-10-CM | POA: Diagnosis not present

## 2018-10-18 DIAGNOSIS — Y9389 Activity, other specified: Secondary | ICD-10-CM | POA: Insufficient documentation

## 2018-10-18 DIAGNOSIS — I10 Essential (primary) hypertension: Secondary | ICD-10-CM | POA: Diagnosis not present

## 2018-10-18 DIAGNOSIS — S60459A Superficial foreign body of unspecified finger, initial encounter: Secondary | ICD-10-CM

## 2018-10-18 DIAGNOSIS — Y929 Unspecified place or not applicable: Secondary | ICD-10-CM | POA: Diagnosis not present

## 2018-10-18 DIAGNOSIS — F17210 Nicotine dependence, cigarettes, uncomplicated: Secondary | ICD-10-CM | POA: Diagnosis not present

## 2018-10-18 MED ORDER — OXYCODONE HCL 5 MG PO TABS
5.0000 mg | ORAL_TABLET | Freq: Once | ORAL | Status: AC
  Administered 2018-10-18: 5 mg via ORAL
  Filled 2018-10-18: qty 1

## 2018-10-18 MED ORDER — SULFAMETHOXAZOLE-TRIMETHOPRIM 800-160 MG PO TABS: 1.0000 | ORAL_TABLET | Freq: Two times a day (BID) | ORAL | 0 refills | Status: DC

## 2018-10-18 MED ORDER — TETANUS-DIPHTH-ACELL PERTUSSIS 5-2.5-18.5 LF-MCG/0.5 IM SUSP
0.5000 mL | Freq: Once | INTRAMUSCULAR | Status: AC
  Administered 2018-10-18: 0.5 mL via INTRAMUSCULAR
  Filled 2018-10-18: qty 0.5

## 2018-10-18 MED ORDER — TRAMADOL HCL 50 MG PO TABS: 50.0000 mg | ORAL_TABLET | Freq: Four times a day (QID) | ORAL | 0 refills | Status: DC | PRN

## 2018-10-18 MED ORDER — LIDOCAINE HCL (PF) 1 % IJ SOLN
5.0000 mL | Freq: Once | INTRAMUSCULAR | Status: AC
  Administered 2018-10-18: 5 mL via INTRADERMAL
  Filled 2018-10-18: qty 5
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
## 2018-10-18 NOTE — Discharge Instructions (Signed)
You need to schedule an appointment with your primary care provider ASAP. Your blood pressure is high today. If you develop any symptoms of concern, return to the ER.

## 2018-10-18 NOTE — ED Triage Notes (Signed)
Presents via ems from work with staple in left 5 th finger

## 2019-02-26 IMAGING — CT CT EXTREM LOW W/O CM*L*
3 of 7 series · 11 of 33 positions shown, 13 images · non-contrast
Comparison: None.

CLINICAL DATA: Severe arthritis of the left knee. Previous proximal
tibia fractures.

EXAM:
CT OF THE LOWER LEFT EXTREMITY WITHOUT CONTRAST
TECHNIQUE: Multidetector CT imaging of the lower left extremity was performed
according to the ZIBA MATCH protocol.

[Series 11: coronal st · coronal · 0.29mm/px · 3 of 58 slices shown]
[im 12/58  bone]
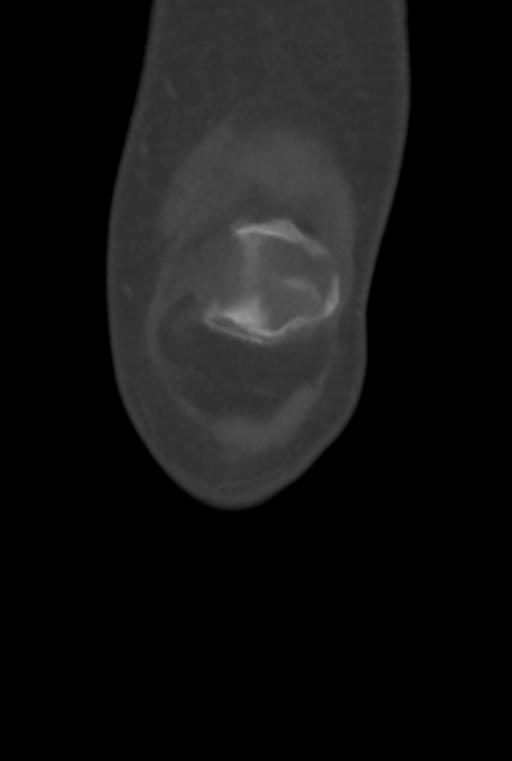
[im 23/58  bone]
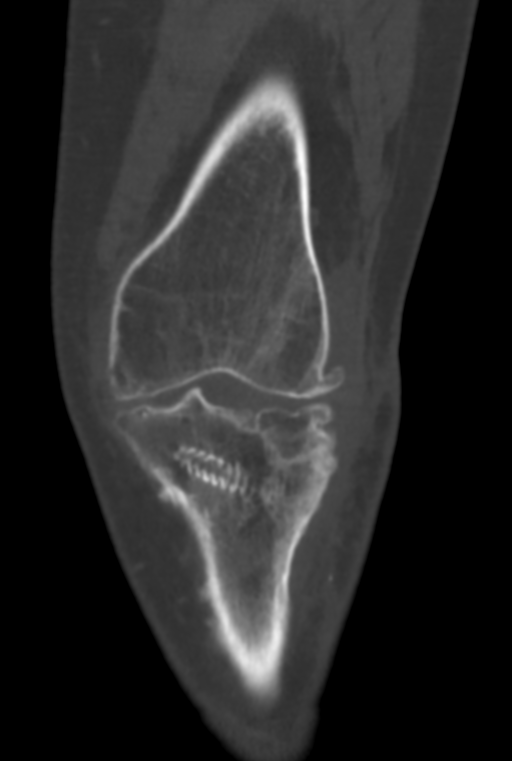
[im 35/58  bone]
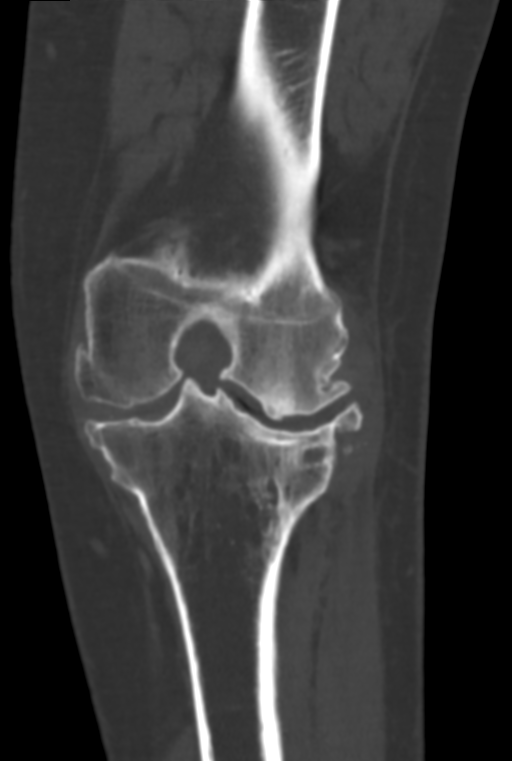

[Series 12: sagittal st · sagittal · 0.22mm/px · 5 of 58 slices shown, 6 images]
[im 20/58  bone]
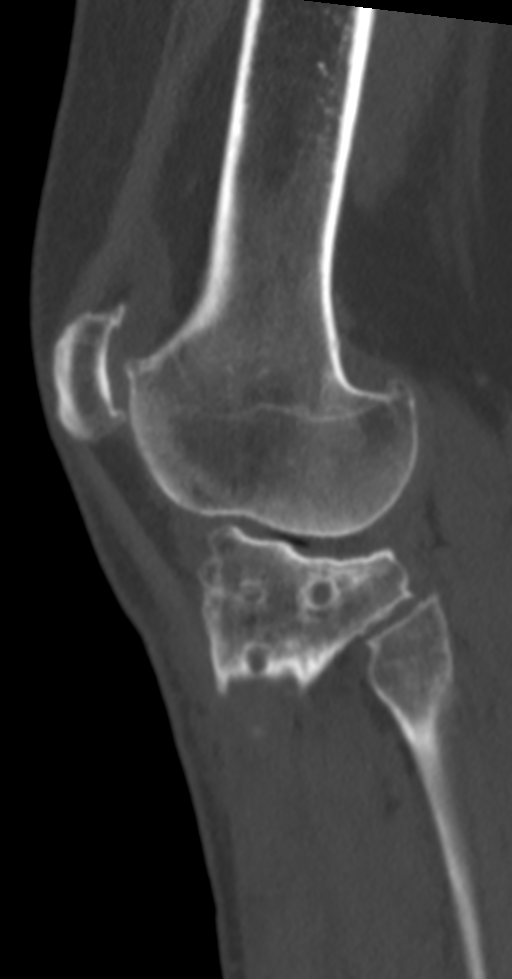
[im 24/58  bone]
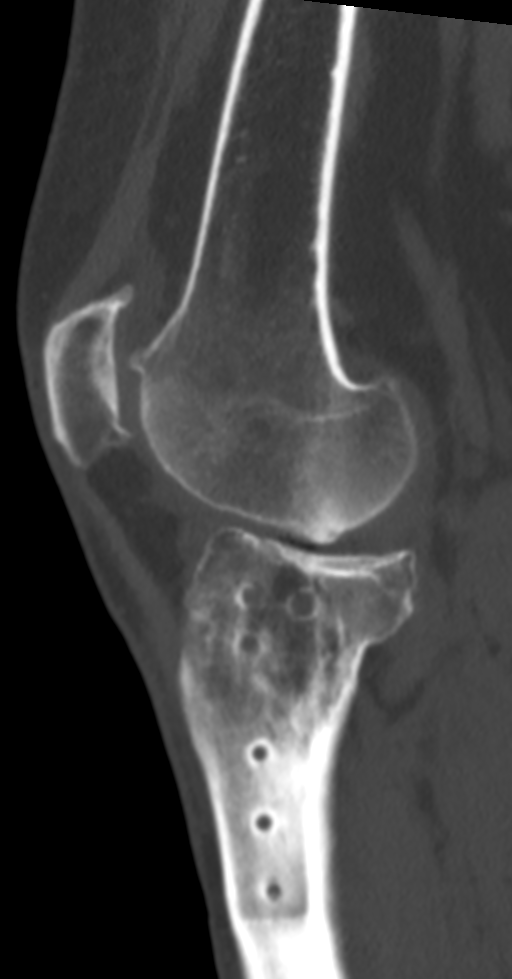
[im 29/58  soft-tissue]
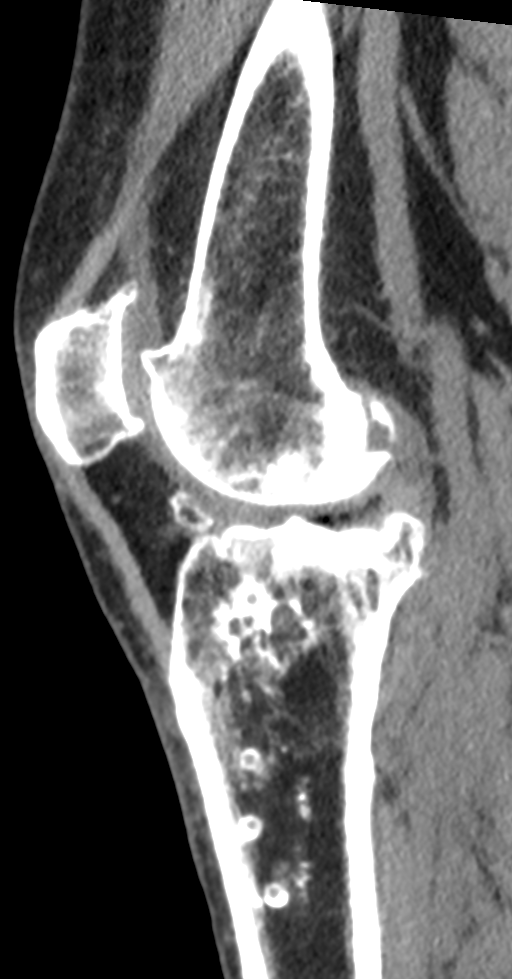
[im 29/58  bone]
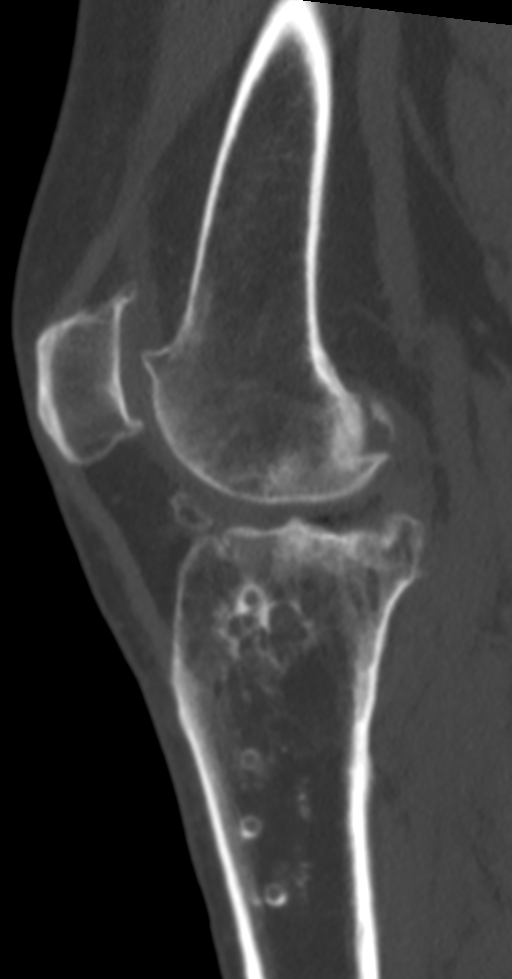
[im 34/58  bone]
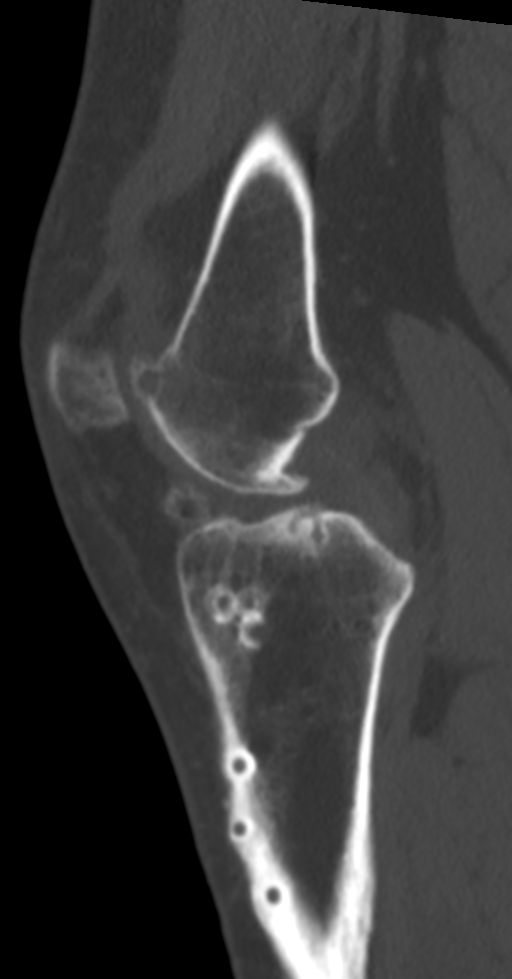
[im 39/58  bone]
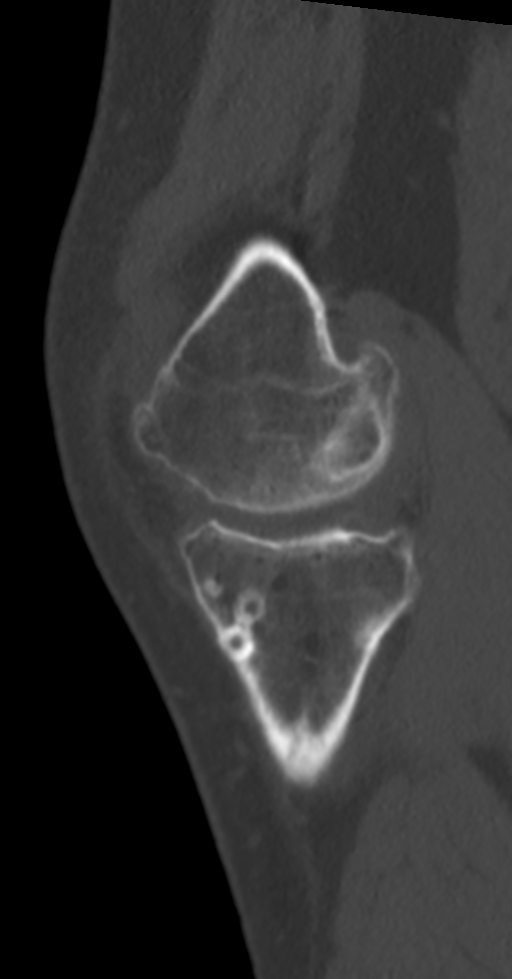

[Series 13: st thins trumatch · axial · 0.42mm/px · z∈[+351,+499]mm · 3 of 371 slices shown, 4 images]
[im 62/371  soft-tissue]
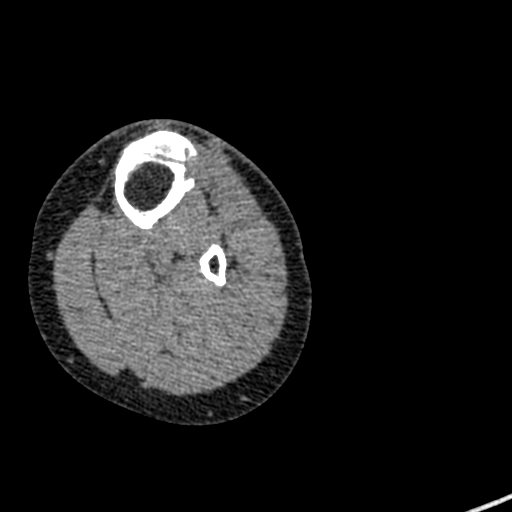
[im 62/371  bone]
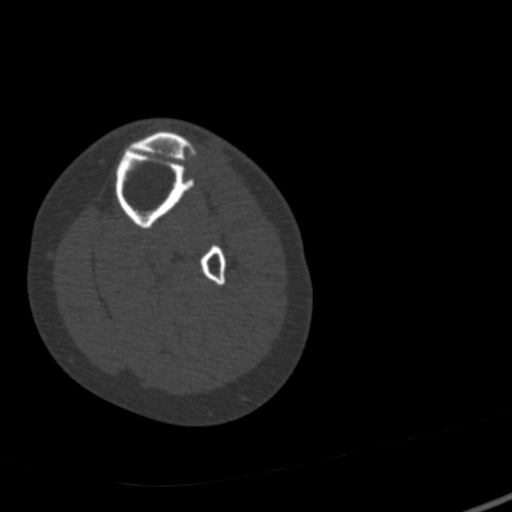
[im 186/371  bone]
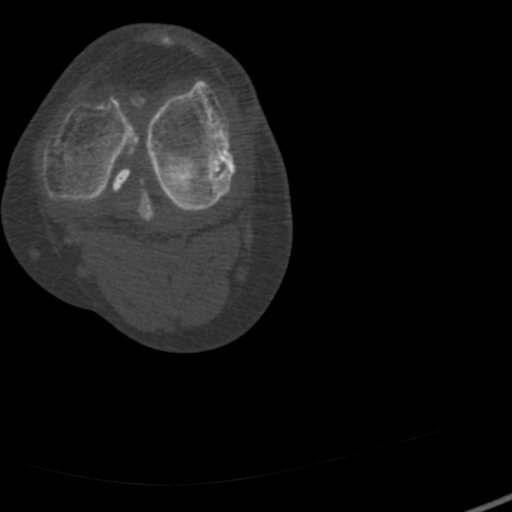
[im 309/371  bone]
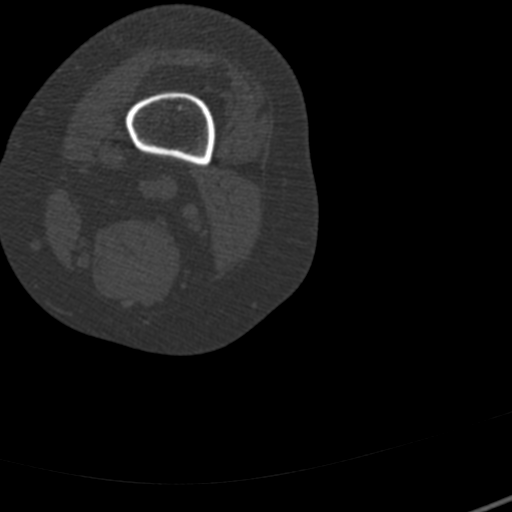

[11 of 33 positions shown; findings below may reference images not displayed]

FINDINGS: Bones/Joint/Cartilage

Axial images through the left ankle and hip appear normal.

The patient has severe arthritic changes of left knee, most
prominent in the lateral compartment. Multiple small ossified loose
bodies in the joint as well as a 16 mm loose body in Hoffa's fat
pad.

Prominent tricompartmental marginal osteophytes. Tricompartmental
joint space narrowing.

Postsurgical changes in the proximal tibia. Fixation hardware has
been removed.

Moderate joint effusion.

Ligaments

Suboptimally assessed by CT. Posterior cruciate ligament is intact.
MCL and LCL are intact. ACL is not discretely identified.

Muscles and Tendons

Normal.

Soft tissues

Normal.
IMPRESSION: Severe tricompartmental arthritis of the left knee with old
posttraumatic changes and postsurgical changes. Several loose bodies
in the joint.

## 2019-06-27 ENCOUNTER — Other Ambulatory Visit: Payer: Self-pay | Admitting: Obstetrics and Gynecology

## 2019-07-02 ENCOUNTER — Other Ambulatory Visit: Payer: Self-pay

## 2019-07-02 ENCOUNTER — Telehealth (INDEPENDENT_AMBULATORY_CARE_PROVIDER_SITE_OTHER): Payer: Self-pay

## 2019-07-02 ENCOUNTER — Encounter
Admission: RE | Admit: 2019-07-02 | Discharge: 2019-07-02 | Disposition: A | Discharge status: Home / Self Care | Payer: BLUE CROSS/BLUE SHIELD | Source: Ambulatory Visit | Attending: Obstetrics and Gynecology | Admitting: Obstetrics and Gynecology

## 2019-07-02 NOTE — Patient Instructions (Signed)
Your procedure is scheduled on: 07-14-19 MONDAY Report to Same Day Surgery 2nd floor medical mall [facility] Entrance-take elevator on left to 2nd floor.  Check in with surgery information desk.) To find out your arrival time please call [phone] between 1PM - 3PM on 07-11-19 FRIDAY  Remember: Instructions that are not followed completely may result in serious medical risk, up to and including death, or upon the discretion of your surgeon and anesthesiologist your surgery may need to be rescheduled.    _x___ 1. Do not eat food after midnight the night before your procedure. NO GUM OR CANDY AFTER MIDNIGHT. You may drink clear liquids up to 2 hours before you are scheduled to arrive at the hospital for your procedure.  Do not drink clear liquids within 2 hours of your scheduled arrival to the hospital.  Clear liquids include  --Water or Apple juice without pulp  --Gatorade  --Black Coffee or Clear Tea (No milk, no creamers, do not add anything to the coffee or Tea-ok to add sugar)   ____Ensure clear carbohydrate drink on the way to the hospital for bariatric patients  _X___Ensure clear carbohydrate drink-FINISH DRINK 2 HOURS PRIOR TO ARRIVAL [place]     __x__ 2. No Alcohol for 24 hours before or after surgery.   __x__3. No Smoking or e-cigarettes for 24 prior to surgery.  Do not use any chewable tobacco products for at least 6 hour prior to surgery   ____  4. Bring all medications with you on the day of surgery if instructed.    __x__ 5. Notify your doctor if there is any change in your medical condition     (cold, fever, infections).    x___6. On the morning of surgery brush your teeth with toothpaste and water.  You may rinse your mouth with mouth wash if you wish.  Do not swallow any toothpaste or mouthwash.   Do not wear jewelry, make-up, hairpins, clips or nail polish.  Do not wear lotions, powders, or perfumes.   Do not shave 48 hours prior to surgery. Men may  shave face and neck.  Do not bring valuables to the hospital.    [facility] is not responsible for any belongings or valuables.               Contacts, dentures or bridgework may not be worn into surgery.  Leave your suitcase in the car. After surgery it may be brought to your room.  For patients admitted to the hospital, discharge time is determined by your treatment team.  _  Patients discharged the day of surgery will not be allowed to drive home.  You will need someone to drive you home and stay with you the night of your procedure.    Please read over the following fact sheets that you were given:   [facility] Preparing for Surgery   ____ TAKE THE FOLLOWING MEDICATION THE MORNING OF SURGERY WITH A SMALL SIP OF WATER. These include:  1. NONE  2.  3.  4.  5.  6.  ____Fleets enema or Magnesium Citrate as directed.   _x___ Use CHG Soap or sage wipes as directed on instruction sheet   ____ Use inhalers on the day of surgery and bring to hospital day of surgery  ____ Stop Metformin and Janumet 2 days prior to surgery.    ____ Take 1/2 of usual insulin dose the night before surgery and none on the morning surgery.   ____  Follow recommendations from Cardiologist, Pulmonologist or PCP regarding stopping Aspirin, Coumadin, Plavix ,Eliquis, Effient, or Pradaxa, and Pletal.  X____Stop Anti-inflammatories such as Advil, Aleve, Ibuprofen, Motrin, Naproxen, Naprosyn, Goodies powders or aspirin products 7 DAYS PRIOR TO SURGERY. OK to take Tylenol OR TRAMADOL IF NEEDED   ____ Stop supplements until after surgery.    ____ Bring C-Pap to the hospital.

## 2019-07-02 NOTE — Telephone Encounter (Signed)
Spoke with the patient and she is scheduled with Dr. Lucky Cowboy for a PICC line insertion on 07/10/19 with a 10:15 am arrival time to the [facility]. Patient will do covid testing on 07/08/19 between 8-1 pm at the [place]. Pre-procedure instructions were discussed and will be mailed to the patient.

## 2019-07-02 NOTE — H&P (Signed)
Chief Complaint:   Yvonne Nelson is a 49 y.o. female presenting with Pre-op Exam   History of Present Illness: Patient presents for pre-operative visit for hysterectomy with salpingo oophorectomy for ddx hydrosalpinx or TOA. She has a history of AUB, ruptured TOA, left pelvic pain. She presented to [facility] in 05/2019 with daily left pelvic pain onset a few months prior that is worse with intercourse or standing for long periods of time. Pelvic ultrasound revealed several structures and cyst that are suspected to be hydrosalpinx or TOA - see below. Patient is worried about ovarian cancer although that is unlikely.   Workup: Pap: 05/2019 neg/neg EMBx 05/2019: benign endometrial polyp (see thickened ES below) TVUS 05/29/2019:  Ut wnl; Endometrium=9.62 mm Lt simple ov cyst= 1.66 cm RO wnl Three rt adnexal cystic/complex areas seen post to RO 1 septated cyst= 3.02 cm 2 complex = 2.70 cm 3 cystic tubular structure=4.88 x 0.94 x 0.97 cm  Pertinent Surgical Hx: 2002- TOA with rupture, spent 11 days in hospital on IV Abx per pt.  Past Medical History:  has a past medical history of Allergic rhinitis (06/04/2015), Chronic hip pain, right (06/04/2015), Chronic pain of left knee (06/04/2015), Gallstones (06/04/2015), Hypertension, and Tobacco use (06/04/2015).  Past Surgical History:  has a past surgical history that includes Hernia repair; metal rod in right thigh (1993); Bone graft left knee and screw placement x 6 (1993); and ovarian abcess (2002). Family History: family history includes Lung cancer in her maternal grandfather; No Known Problems in her paternal grandfather and paternal grandmother; Osteoporosis (Thinning of bones) in her maternal grandmother; Rheum arthritis in her maternal grandmother. Social History:  reports that she has been smoking cigarettes. She has a 15.00 pack-year smoking history. She has never used smokeless tobacco. She reports current alcohol use. She reports  that she does not use drugs. OB/GYN History:  OB History    Gravida  0   Para  0   Term  0   Preterm  0   AB  0   Living  0     SAB  0   TAB  0   Ectopic  0   Molar  0   Multiple  0   Live Births  0         Allergies: has No Known Allergies. Medications: Current Outpatient Medications:  .  hydroCHLOROthiazide (HYDRODIURIL) 25 MG tablet, Take 1 tablet (25 mg total) by mouth once daily, Disp: 90 tablet, Rfl: 3 .  MULTIVITAMIN ORAL, Take by mouth, Disp: , Rfl:  .  naproxen sodium (ALEVE, ANAPROX) 220 MG tablet, Take 220 mg by mouth 2 (two) times daily with meals., Disp: , Rfl:  .  gabapentin (NEURONTIN) 100 MG capsule, Take 3 capsules (300 mg total) by mouth 2 (two) times daily (Patient not taking: Reported on 05/21/2019  ), Disp: 180 capsule, Rfl: 11  Review of Systems: No SOB, no palpitations or chest pain, no new lower extremity edema, no nausea or vomiting or bowel or bladder complaints. See HPI for gyn specific ROS.   Exam:   BP 146/86   Ht 162.6 cm (5\' 4" )   Wt 88 kg (194 lb)   LMP 07/03/2018 (Approximate)   BMI 33.30 kg/m   General: Patient is well-groomed, well-nourished, appears stated age in no acute distress   HEENT: head is atraumatic and normocephalic, trachea is midline, neck is supple with no palpable nodules   CV: Regular rhythm and normal heart rate, no murmur  Pulm: Clear to auscultation throughout lung fields with no wheezing, crackles, or rhonchi. No increased work of breathing  Abdomen: soft , no mass, non-tender, no rebound tenderness, no hepatomegaly  Pelvic: deferred today, diffusely tender at last exam   Impression:   The primary encounter diagnosis was Preoperative clearance. A diagnosis of Pelvic pain in female was also pertinent to this visit.    Plan:    Patient returns for a preoperative discussion regarding her plans to proceed with surgical treatment of her pelvic pain, right complex adnexal cyst x1, septated right  adnexal cyst x1, cystic tubular structure in right adnexa x1, and by patient's strong request due to her worry for ovarian cancer by total laparoscopic hysterectomy with bilateral salpingo oophorectomy procedure. We may perform a cystoscopy to evaluate the urinary tract after the procedure, if surgically indicated for uro tract integrity.   The patient and I discussed the technical aspects of the procedure including the potential for risks and complications. These include but are not limited to the risk of infection requiring post-operative antibiotics or further procedures. We talked about the risk of injury to adjacent organs including bladder, bowel, ureter, blood vessels or nerves. We talked about the need to convert to an open incision. We talked about the possible need for blood transfusion. We talked aboutpostop complications such asthromboembolic or cardiopulmonary complications. All of her questions were answered.  Her preoperative exam was completed and the appropriate consents were signed. She is scheduled to undergo this procedure in the near future.  She will have a PICC placed the week before the procedure and managed by [facility] (scheduled for 07/08/19) due to poor venous access  Specific Peri-operative Considerations:  - Consent: obtained today - Health Maintenance: Patient reports weak spider veins all in her arm that have made it nearly impossible for IV's.  - Labs: CBC, CMP preoperatively - Studies: EKG, CXR preoperatively - Bowel Preparation: None required - Abx:  Ancef 2g - VTE ppx: SCDs perioperatively - Glucose Protocol: n/a - Beta-blockade: n/a    Diagnoses and all orders for this visit:  Preoperative clearance  Pelvic pain in female    Return for Postop check.  Sherrie George, MD

## 2019-07-08 ENCOUNTER — Other Ambulatory Visit: Payer: BLUE CROSS/BLUE SHIELD

## 2019-07-08 ENCOUNTER — Encounter
Admission: RE | Admit: 2019-07-08 | Discharge: 2019-07-08 | Disposition: A | Discharge status: Home / Self Care | Payer: BLUE CROSS/BLUE SHIELD | Source: Ambulatory Visit | Attending: Vascular Surgery | Admitting: Vascular Surgery

## 2019-07-08 ENCOUNTER — Other Ambulatory Visit: Payer: Self-pay

## 2019-07-08 DIAGNOSIS — Z01812 Encounter for preprocedural laboratory examination: Secondary | ICD-10-CM | POA: Diagnosis present

## 2019-07-08 DIAGNOSIS — Z20822 Contact with and (suspected) exposure to covid-19: Secondary | ICD-10-CM | POA: Insufficient documentation

## 2019-07-08 LAB — CBC
HCT: 47.5 % — ABNORMAL HIGH (ref 36.0–46.0)
Hemoglobin: 15.9 g/dL — ABNORMAL HIGH (ref 12.0–15.0)
MCH: 30.3 pg (ref 26.0–34.0)
MCHC: 33.5 g/dL (ref 30.0–36.0)
MCV: 90.5 fL (ref 80.0–100.0)
Platelets: 238 10*3/uL (ref 150–400)
RBC: 5.25 MIL/uL — ABNORMAL HIGH (ref 3.87–5.11)
RDW: 12.8 % (ref 11.5–15.5)
WBC: 7.2 10*3/uL (ref 4.0–10.5)
nRBC: 0 % (ref 0.0–0.2)

## 2019-07-08 LAB — BASIC METABOLIC PANEL
Anion gap: 9 (ref 5–15)
BUN: 12 mg/dL (ref 6–20)
CO2: 26 mmol/L (ref 22–32)
Calcium: 9.3 mg/dL (ref 8.9–10.3)
Chloride: 102 mmol/L (ref 98–111)
Creatinine, Ser: 0.54 mg/dL (ref 0.44–1.00)
GFR calc Af Amer: >60 mL/min (ref >60)
GFR calc non Af Amer: >60 mL/min (ref >60)
Glucose, Bld: 97 mg/dL (ref 70–99)
Potassium: 3.6 mmol/L (ref 3.5–5.1)
Sodium: 137 mmol/L (ref 135–145)

## 2019-07-08 LAB — SARS CORONAVIRUS 2 (TAT 6-24 HRS): SARS Coronavirus 2: NEGATIVE

## 2019-07-08 LAB — TYPE AND SCREEN
ABO/RH(D): O POS
Antibody Screen: NEGATIVE

## 2019-07-09 ENCOUNTER — Other Ambulatory Visit (INDEPENDENT_AMBULATORY_CARE_PROVIDER_SITE_OTHER): Payer: Self-pay | Admitting: Nurse Practitioner

## 2019-07-10 ENCOUNTER — Other Ambulatory Visit: Payer: Self-pay

## 2019-07-10 ENCOUNTER — Encounter: Payer: Self-pay | Admitting: Vascular Surgery

## 2019-07-10 ENCOUNTER — Encounter
Admission: RE | Disposition: A | Discharge status: Home / Self Care | Payer: Self-pay | Source: Home / Self Care | Attending: Vascular Surgery

## 2019-07-10 ENCOUNTER — Ambulatory Visit
Admission: RE | Admit: 2019-07-10 | Discharge: 2019-07-10 | Disposition: A | Discharge status: Home / Self Care | Payer: BLUE CROSS/BLUE SHIELD | Attending: Vascular Surgery | Admitting: Vascular Surgery

## 2019-07-10 ENCOUNTER — Other Ambulatory Visit: Payer: BLUE CROSS/BLUE SHIELD

## 2019-07-10 DIAGNOSIS — N7011 Chronic salpingitis: Secondary | ICD-10-CM | POA: Diagnosis not present

## 2019-07-10 DIAGNOSIS — I10 Essential (primary) hypertension: Secondary | ICD-10-CM | POA: Diagnosis not present

## 2019-07-10 DIAGNOSIS — F17210 Nicotine dependence, cigarettes, uncomplicated: Secondary | ICD-10-CM | POA: Insufficient documentation

## 2019-07-10 DIAGNOSIS — I998 Other disorder of circulatory system: Secondary | ICD-10-CM

## 2019-07-10 DIAGNOSIS — R102 Pelvic and perineal pain: Secondary | ICD-10-CM

## 2019-07-10 DIAGNOSIS — Z792 Long term (current) use of antibiotics: Secondary | ICD-10-CM

## 2019-07-10 HISTORY — PX: PICC LINE INSERTION: CATH118290

## 2019-07-10 SURGERY — PICC LINE INSERTION
Anesthesia: Moderate Sedation

## 2019-07-10 MED ORDER — HEPARIN SOD (PORK) LOCK FLUSH 100 UNIT/ML IV SOLN
INTRAVENOUS | Status: AC
  Filled 2019-07-10: qty 5

## 2019-07-10 MED ORDER — HEPARIN SODIUM (PORCINE) 10000 UNIT/ML IJ SOLN
INTRAMUSCULAR | Status: AC
  Filled 2019-07-10: qty 1

## 2019-07-10 SURGICAL SUPPLY — 2 items
CATH PICC 5FR POWER SINGLE (CATHETERS) ×3 IMPLANT
PACK ANGIOGRAPHY (CUSTOM PROCEDURE TRAY) ×3 IMPLANT

## 2019-07-10 NOTE — H&P (Signed)
[facility] History & Physical Update  The patient was interviewed and re-examined.  The patient's previous History and Physical has been reviewed and is unchanged.  There is no change in the plan of care. We plan to proceed with the scheduled procedure.  Leotis Pain, MD  07/10/2019, 10:00 AM
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
## 2019-07-10 NOTE — Op Note (Signed)
[facility]   OPERATIVE NOTE    PRE-OPERATIVE DIAGNOSIS: hydrosalpinx, pelvic pain, poor venous access  POST-OPERATIVE DIAGNOSIS: same as above  PROCEDURE: 1.  1. Ultrasound guidance for vascular access to right brachial vein 2. Fluoroscopic guidance for placement of catheter 3. Insertion of peripherally inserted central venous catheter to the right brachial vein vein  SURGEON: Leotis Pain, MD  ANESTHESIA: local  ESTIMATED BLOOD LOSS: minimal  FINDING(S): 1.  none  SPECIMEN(S):  none  INDICATIONS:   Yvonne Nelson is a 49 y.o. female who presents with pelvic pain and hydrosalpinx. We are asked to place a PICC line for long term IV access.  Risks and benefits are discussed, and informed consent was obtained.  DESCRIPTION: After obtaining full informed written consent, the patient was brought back to the vascular suite.  The patient's  right arm was sterilely prepped and draped, and a sterile surgical field was created. The right brachial vein was accessed under direct ultrasound guidance without difficulty with a micropuncture needle and a permanent image was recorded. A 0.018 wire was then placed into the superior vena cava. Peel-away sheath was placed over the wire. A peripherally inserted central venous catheter was then placed over the wire and the wire and peel-away sheath were removed. The catheter tip was placed into the superior vena cava and was secured at the skin at 34 cm with a sterile dressing. The catheter withdrew blood well and flushed easily with heparinized saline. The patient tolerated the procedure well without complications.  COMPLICATIONS: none  CONDITION: stable  Leotis Pain  07/10/2019, 12:35 PM  This note was created with Dragon Medical transcription system. Any errors in dictation are purely unintentional.

## 2019-07-14 ENCOUNTER — Ambulatory Visit
Admission: RE | Admit: 2019-07-14 | Discharge: 2019-07-14 | Disposition: A | Discharge status: Home / Self Care | Payer: BLUE CROSS/BLUE SHIELD | Attending: Obstetrics and Gynecology | Admitting: Obstetrics and Gynecology

## 2019-07-14 ENCOUNTER — Encounter
Admission: RE | Disposition: A | Discharge status: Home / Self Care | Payer: Self-pay | Source: Home / Self Care | Attending: Obstetrics and Gynecology

## 2019-07-14 ENCOUNTER — Ambulatory Visit: Payer: BLUE CROSS/BLUE SHIELD | Admitting: Registered Nurse

## 2019-07-14 ENCOUNTER — Other Ambulatory Visit: Payer: Self-pay

## 2019-07-14 ENCOUNTER — Encounter: Payer: Self-pay | Admitting: Obstetrics and Gynecology

## 2019-07-14 DIAGNOSIS — I10 Essential (primary) hypertension: Secondary | ICD-10-CM | POA: Insufficient documentation

## 2019-07-14 DIAGNOSIS — D259 Leiomyoma of uterus, unspecified: Secondary | ICD-10-CM | POA: Insufficient documentation

## 2019-07-14 DIAGNOSIS — R102 Pelvic and perineal pain: Secondary | ICD-10-CM | POA: Insufficient documentation

## 2019-07-14 DIAGNOSIS — N736 Female pelvic peritoneal adhesions (postinfective): Secondary | ICD-10-CM | POA: Diagnosis not present

## 2019-07-14 DIAGNOSIS — Z79899 Other long term (current) drug therapy: Secondary | ICD-10-CM | POA: Diagnosis not present

## 2019-07-14 DIAGNOSIS — N8311 Corpus luteum cyst of right ovary: Secondary | ICD-10-CM | POA: Diagnosis not present

## 2019-07-14 DIAGNOSIS — F17210 Nicotine dependence, cigarettes, uncomplicated: Secondary | ICD-10-CM | POA: Insufficient documentation

## 2019-07-14 DIAGNOSIS — Z791 Long term (current) use of non-steroidal anti-inflammatories (NSAID): Secondary | ICD-10-CM | POA: Diagnosis not present

## 2019-07-14 HISTORY — PX: TOTAL LAPAROSCOPIC HYSTERECTOMY WITH BILATERAL SALPINGO OOPHORECTOMY: SHX6845

## 2019-07-14 HISTORY — PX: LAPAROSCOPIC BILATERAL SALPINGO OOPHERECTOMY: SHX5890

## 2019-07-14 HISTORY — PX: CYSTOSCOPY: SHX5120

## 2019-07-14 LAB — POCT PREGNANCY, URINE: Preg Test, Ur: NEGATIVE

## 2019-07-14 SURGERY — HYSTERECTOMY, TOTAL, LAPAROSCOPIC, WITH BILATERAL SALPINGO-OOPHORECTOMY
Anesthesia: General

## 2019-07-14 MED ORDER — SEVOFLURANE IN SOLN
RESPIRATORY_TRACT | Status: AC
  Filled 2019-07-14: qty 250

## 2019-07-14 MED ORDER — ACETAMINOPHEN 500 MG PO TABS
1000.0000 mg | ORAL_TABLET | Freq: Four times a day (QID) | ORAL | 0 refills | Status: AC
Start: 2019-07-14 — End: 2019-07-17

## 2019-07-14 MED ORDER — LACTATED RINGERS IV SOLN: INTRAVENOUS | Status: DC

## 2019-07-14 MED ORDER — ONDANSETRON HCL 4 MG/2ML IJ SOLN: 4.0000 mg | Freq: Once | INTRAMUSCULAR | Status: DC | PRN

## 2019-07-14 MED ORDER — DEXAMETHASONE SODIUM PHOSPHATE 10 MG/ML IJ SOLN
INTRAMUSCULAR | Status: AC
  Filled 2019-07-14: qty 1

## 2019-07-14 MED ORDER — SUGAMMADEX SODIUM 200 MG/2ML IV SOLN
INTRAVENOUS | Status: DC | PRN
  Administered 2019-07-14: 200 mg via INTRAVENOUS

## 2019-07-14 MED ORDER — GABAPENTIN 300 MG PO CAPS
ORAL_CAPSULE | ORAL | Status: AC
  Filled 2019-07-14: qty 1

## 2019-07-14 MED ORDER — MIDAZOLAM HCL 2 MG/2ML IJ SOLN
INTRAMUSCULAR | Status: DC | PRN
  Administered 2019-07-14: 2 mg via INTRAVENOUS

## 2019-07-14 MED ORDER — DEXAMETHASONE SODIUM PHOSPHATE 10 MG/ML IJ SOLN
INTRAMUSCULAR | Status: DC | PRN
  Administered 2019-07-14: 10 mg via INTRAVENOUS

## 2019-07-14 MED ORDER — FAMOTIDINE 20 MG PO TABS
20.0000 mg | ORAL_TABLET | Freq: Once | ORAL | Status: AC
  Administered 2019-07-14: 20 mg via ORAL

## 2019-07-14 MED ORDER — GABAPENTIN 800 MG PO TABS: 800.0000 mg | ORAL_TABLET | Freq: Every day | ORAL | 0 refills | Status: AC

## 2019-07-14 MED ORDER — PHENYLEPHRINE HCL (PRESSORS) 10 MG/ML IV SOLN
INTRAVENOUS | Status: AC
  Filled 2019-07-14: qty 1

## 2019-07-14 MED ORDER — ACETAMINOPHEN 500 MG PO TABS
ORAL_TABLET | ORAL | Status: AC
  Filled 2019-07-14: qty 2

## 2019-07-14 MED ORDER — SUCCINYLCHOLINE CHLORIDE 200 MG/10ML IV SOSY
PREFILLED_SYRINGE | INTRAVENOUS | Status: AC
  Filled 2019-07-14: qty 10

## 2019-07-14 MED ORDER — SODIUM CHLORIDE 0.9 % IV SOLN
INTRAVENOUS | Status: DC | PRN
  Administered 2019-07-14: 20 ug/min via INTRAVENOUS

## 2019-07-14 MED ORDER — ROCURONIUM BROMIDE 100 MG/10ML IV SOLN
INTRAVENOUS | Status: DC | PRN
  Administered 2019-07-14: 20 mg via INTRAVENOUS
  Administered 2019-07-14: 30 mg via INTRAVENOUS
  Administered 2019-07-14: 50 mg via INTRAVENOUS
  Administered 2019-07-14: 40 mg via INTRAVENOUS

## 2019-07-14 MED ORDER — OXYCODONE HCL 5 MG PO TABS: 5.0000 mg | ORAL_TABLET | ORAL | Status: DC | PRN

## 2019-07-14 MED ORDER — SUCCINYLCHOLINE CHLORIDE 20 MG/ML IJ SOLN
INTRAMUSCULAR | Status: DC | PRN
  Administered 2019-07-14: 120 mg via INTRAVENOUS

## 2019-07-14 MED ORDER — EPHEDRINE SULFATE 50 MG/ML IJ SOLN
INTRAMUSCULAR | Status: DC | PRN
  Administered 2019-07-14: 10 mg via INTRAVENOUS
  Administered 2019-07-14: 5 mg via INTRAVENOUS

## 2019-07-14 MED ORDER — FENTANYL CITRATE (PF) 100 MCG/2ML IJ SOLN
INTRAMUSCULAR | Status: AC
  Administered 2019-07-14: 25 ug via INTRAVENOUS
  Filled 2019-07-14: qty 2

## 2019-07-14 MED ORDER — IBUPROFEN 800 MG PO TABS: 800.0000 mg | ORAL_TABLET | Freq: Three times a day (TID) | ORAL | 1 refills | Status: AC

## 2019-07-14 MED ORDER — CEFAZOLIN SODIUM-DEXTROSE 2-4 GM/100ML-% IV SOLN
INTRAVENOUS | Status: AC
  Filled 2019-07-14: qty 100

## 2019-07-14 MED ORDER — FENTANYL CITRATE (PF) 100 MCG/2ML IJ SOLN
INTRAMUSCULAR | Status: AC
  Filled 2019-07-14: qty 2

## 2019-07-14 MED ORDER — CEFAZOLIN SODIUM-DEXTROSE 2-4 GM/100ML-% IV SOLN
2.0000 g | INTRAVENOUS | Status: AC
  Administered 2019-07-14: 08:00:00 2 g via INTRAVENOUS

## 2019-07-14 MED ORDER — FAMOTIDINE 20 MG PO TABS
ORAL_TABLET | ORAL | Status: AC
  Filled 2019-07-14: qty 1

## 2019-07-14 MED ORDER — BUPIVACAINE HCL 0.5 % IJ SOLN
INTRAMUSCULAR | Status: DC | PRN
  Administered 2019-07-14: 13 mL

## 2019-07-14 MED ORDER — ROCURONIUM BROMIDE 10 MG/ML (PF) SYRINGE
PREFILLED_SYRINGE | INTRAVENOUS | Status: AC
  Filled 2019-07-14: qty 10

## 2019-07-14 MED ORDER — LIDOCAINE HCL (PF) 2 % IJ SOLN
INTRAMUSCULAR | Status: AC
  Filled 2019-07-14: qty 5

## 2019-07-14 MED ORDER — PROPOFOL 10 MG/ML IV BOLUS
INTRAVENOUS | Status: AC
  Filled 2019-07-14: qty 20

## 2019-07-14 MED ORDER — BUPIVACAINE HCL (PF) 0.5 % IJ SOLN
INTRAMUSCULAR | Status: AC
  Filled 2019-07-14: qty 30

## 2019-07-14 MED ORDER — ACETAMINOPHEN 500 MG PO TABS
1000.0000 mg | ORAL_TABLET | ORAL | Status: AC
  Administered 2019-07-14: 1000 mg via ORAL

## 2019-07-14 MED ORDER — FENTANYL CITRATE (PF) 100 MCG/2ML IJ SOLN
25.0000 ug | INTRAMUSCULAR | Status: AC | PRN
  Administered 2019-07-14 (×6): 25 ug via INTRAVENOUS

## 2019-07-14 MED ORDER — OXYCODONE HCL 5 MG PO TABS
ORAL_TABLET | ORAL | Status: AC
  Administered 2019-07-14: 5 mg via ORAL
  Filled 2019-07-14: qty 1

## 2019-07-14 MED ORDER — FENTANYL CITRATE (PF) 100 MCG/2ML IJ SOLN
INTRAMUSCULAR | Status: AC
  Administered 2019-07-14: 11:00:00 25 ug via INTRAVENOUS
  Filled 2019-07-14: qty 2

## 2019-07-14 MED ORDER — GABAPENTIN 300 MG PO CAPS
300.0000 mg | ORAL_CAPSULE | ORAL | Status: AC
  Administered 2019-07-14: 06:00:00 300 mg via ORAL

## 2019-07-14 MED ORDER — OXYCODONE HCL 5 MG PO TABS: 5.0000 mg | ORAL_TABLET | ORAL | 0 refills | Status: AC | PRN

## 2019-07-14 MED ORDER — LIDOCAINE HCL (CARDIAC) PF 100 MG/5ML IV SOSY
PREFILLED_SYRINGE | INTRAVENOUS | Status: DC | PRN
  Administered 2019-07-14: 100 mg via INTRAVENOUS

## 2019-07-14 MED ORDER — EPHEDRINE 5 MG/ML INJ
INTRAVENOUS | Status: AC
  Filled 2019-07-14: qty 10

## 2019-07-14 MED ORDER — PROPOFOL 10 MG/ML IV BOLUS
INTRAVENOUS | Status: DC | PRN
  Administered 2019-07-14: 180 mg via INTRAVENOUS

## 2019-07-14 MED ORDER — FENTANYL CITRATE (PF) 100 MCG/2ML IJ SOLN
INTRAMUSCULAR | Status: DC | PRN
  Administered 2019-07-14: 50 ug via INTRAVENOUS
  Administered 2019-07-14: 100 ug via INTRAVENOUS
  Administered 2019-07-14 (×2): 25 ug via INTRAVENOUS

## 2019-07-14 MED ORDER — ONDANSETRON HCL 4 MG/2ML IJ SOLN
INTRAMUSCULAR | Status: DC | PRN
  Administered 2019-07-14: 4 mg via INTRAVENOUS

## 2019-07-14 MED ORDER — MIDAZOLAM HCL 2 MG/2ML IJ SOLN
INTRAMUSCULAR | Status: AC
  Filled 2019-07-14: qty 2

## 2019-07-14 MED ORDER — DOCUSATE SODIUM 100 MG PO CAPS
100.0000 mg | ORAL_CAPSULE | Freq: Two times a day (BID) | ORAL | 0 refills | Status: AC
Start: 2019-07-14 — End: ?

## 2019-07-14 SURGICAL SUPPLY — 93 items
BAG URINE DRAIN 2000ML AR STRL (UROLOGICAL SUPPLIES) ×10 IMPLANT
BLADE SURG SZ11 CARB STEEL (BLADE) ×5 IMPLANT
CANISTER SUCT 1200ML W/VALVE (MISCELLANEOUS) ×5 IMPLANT
CATH FOLEY 2WAY  5CC 16FR (CATHETERS) ×2
CATH URTH 16FR FL 2W BLN LF (CATHETERS) ×3 IMPLANT
CHLORAPREP W/TINT 26 (MISCELLANEOUS) ×5 IMPLANT
CLOSURE WOUND 1/4X4 (GAUZE/BANDAGES/DRESSINGS) ×1
CORD MONOPOLAR M/FML 12FT (MISCELLANEOUS) ×5 IMPLANT
COUNTER NEEDLE 20/40 LG (NEEDLE) ×5 IMPLANT
COVER LIGHT HANDLE STERIS (MISCELLANEOUS) ×10 IMPLANT
COVER WAND RF STERILE (DRAPES) ×5 IMPLANT
DERMABOND ADVANCED (GAUZE/BANDAGES/DRESSINGS) ×2
DERMABOND ADVANCED .7 DNX12 (GAUZE/BANDAGES/DRESSINGS) ×3 IMPLANT
DEVICE SUTURE ENDOST 10MM (ENDOMECHANICALS) ×2 IMPLANT
DRAPE GENERAL ENDO 106X123.5 (DRAPES) ×5 IMPLANT
DRAPE LAP W/FLUID (DRAPES) ×5 IMPLANT
DRAPE STERI POUCH LG 24X46 STR (DRAPES) ×5 IMPLANT
DRAPE UNDER BUTTOCK W/FLU (DRAPES) ×5 IMPLANT
DRSG TEGADERM 2-3/8X2-3/4 SM (GAUZE/BANDAGES/DRESSINGS) ×15 IMPLANT
DRSG TELFA 3X8 NADH (GAUZE/BANDAGES/DRESSINGS) IMPLANT
ELECT CAUTERY BLADE 6.4 (BLADE) ×3 IMPLANT
ELECT REM PT RETURN 9FT ADLT (ELECTROSURGICAL) ×5
ELECTRODE REM PT RTRN 9FT ADLT (ELECTROSURGICAL) ×3 IMPLANT
GAUZE 4X4 16PLY RFD (DISPOSABLE) ×3 IMPLANT
GAUZE SPONGE 4X4 12PLY STRL (GAUZE/BANDAGES/DRESSINGS) ×3 IMPLANT
GLOVE BIO SURGEON STRL SZ7 (GLOVE) ×17 IMPLANT
GLOVE INDICATOR 7.5 STRL GRN (GLOVE) ×11 IMPLANT
GOWN STRL REUS W/ TWL LRG LVL3 (GOWN DISPOSABLE) ×6 IMPLANT
GOWN STRL REUS W/ TWL XL LVL3 (GOWN DISPOSABLE) ×3 IMPLANT
GOWN STRL REUS W/TWL LRG LVL3 (GOWN DISPOSABLE) ×4
GOWN STRL REUS W/TWL XL LVL3 (GOWN DISPOSABLE) ×2
GRASPER SUT TROCAR 14GX15 (MISCELLANEOUS) ×5 IMPLANT
IRRIGATION STRYKERFLOW (MISCELLANEOUS) ×3 IMPLANT
IRRIGATOR STRYKERFLOW (MISCELLANEOUS) ×5
IV NS 1000ML (IV SOLUTION) ×2
IV NS 1000ML BAXH (IV SOLUTION) ×3 IMPLANT
KIT PINK PAD W/HEAD ARE REST (MISCELLANEOUS) ×5
KIT PINK PAD W/HEAD ARM REST (MISCELLANEOUS) ×3 IMPLANT
KIT TURNOVER CYSTO (KITS) ×5 IMPLANT
L-HOOK LAP DISP 36CM (ELECTROSURGICAL) ×10
LABEL OR SOLS (LABEL) ×5 IMPLANT
LHOOK LAP DISP 36CM (ELECTROSURGICAL) IMPLANT
LIGASURE VESSEL 5MM BLUNT TIP (ELECTROSURGICAL) ×2 IMPLANT
MANIPULATOR VCARE LG CRV RETR (MISCELLANEOUS) IMPLANT
MANIPULATOR VCARE SML CRV RETR (MISCELLANEOUS) IMPLANT
MANIPULATOR VCARE STD CRV RETR (MISCELLANEOUS) ×2 IMPLANT
NDL FILTER BLUNT 18X1 1/2 (NEEDLE) ×3 IMPLANT
NEEDLE FILTER BLUNT 18X 1/2SAF (NEEDLE) ×2
NEEDLE FILTER BLUNT 18X1 1/2 (NEEDLE) ×3 IMPLANT
NS IRRIG 500ML POUR BTL (IV SOLUTION) ×5 IMPLANT
OCCLUDER COLPOPNEUMO (BALLOONS) ×5 IMPLANT
PACK BASIN MAJOR (MISCELLANEOUS) ×3 IMPLANT
PACK GYN LAPAROSCOPIC (MISCELLANEOUS) ×5 IMPLANT
PAD DRESSING TELFA 3X8 NADH (GAUZE/BANDAGES/DRESSINGS) ×3 IMPLANT
PAD OB MATERNITY 4.3X12.25 (PERSONAL CARE ITEMS) ×5 IMPLANT
PAD PREP 24X41 OB/GYN DISP (PERSONAL CARE ITEMS) ×5 IMPLANT
PENCIL ELECTRO HAND CTR (MISCELLANEOUS) ×4 IMPLANT
POUCH SPECIMEN RETRIEVAL 10MM (ENDOMECHANICALS) IMPLANT
SCISSORS METZENBAUM CVD 33 (INSTRUMENTS) ×2 IMPLANT
SET CYSTO W/LG BORE CLAMP LF (SET/KITS/TRAYS/PACK) IMPLANT
SET TUBE SMOKE EVAC HIGH FLOW (TUBING) ×5 IMPLANT
SLEEVE ENDOPATH XCEL 5M (ENDOMECHANICALS) ×5 IMPLANT
SOL PREP PVP 2OZ (MISCELLANEOUS) ×5
SOLUTION PREP PVP 2OZ (MISCELLANEOUS) ×3 IMPLANT
SPONGE GAUZE 2X2 8PLY STER LF (GAUZE/BANDAGES/DRESSINGS) ×2
SPONGE GAUZE 2X2 8PLY STRL LF (GAUZE/BANDAGES/DRESSINGS) ×8 IMPLANT
STAPLER SKIN PROX 35W (STAPLE) ×3 IMPLANT
STRIP CLOSURE SKIN 1/4X4 (GAUZE/BANDAGES/DRESSINGS) ×4 IMPLANT
SURGILUBE 2OZ TUBE FLIPTOP (MISCELLANEOUS) ×5 IMPLANT
SUT ENDO VLOC 180-0-8IN (SUTURE) IMPLANT
SUT MNCRL 4-0 (SUTURE) ×2
SUT MNCRL 4-0 27XMFL (SUTURE) ×3
SUT MNCRL AB 4-0 PS2 18 (SUTURE) ×3 IMPLANT
SUT VIC AB 0 CT1 27 (SUTURE)
SUT VIC AB 0 CT1 27XCR 8 STRN (SUTURE) ×9 IMPLANT
SUT VIC AB 0 CT1 36 (SUTURE) ×8 IMPLANT
SUT VIC AB 2-0 SH 27 (SUTURE)
SUT VIC AB 2-0 SH 27XBRD (SUTURE) ×12 IMPLANT
SUT VIC AB 2-0 UR6 27 (SUTURE) ×3 IMPLANT
SUT VIC AB 4-0 SH 27 (SUTURE)
SUT VIC AB 4-0 SH 27XANBCTRL (SUTURE) ×3 IMPLANT
SUT VICRYL PLUS ABS 0 54 (SUTURE) ×3 IMPLANT
SUTURE MNCRL 4-0 27XMF (SUTURE) ×3 IMPLANT
SYR 10ML LL (SYRINGE) ×5 IMPLANT
SYR 50ML LL SCALE MARK (SYRINGE) ×5 IMPLANT
SYR 5ML LL (SYRINGE) ×5 IMPLANT
SYR BULB IRRIG 60ML STRL (SYRINGE) ×3 IMPLANT
TRAY FOLEY MTR SLVR 16FR STAT (SET/KITS/TRAYS/PACK) ×3 IMPLANT
TROCAR ENDO BLADELESS 11MM (ENDOMECHANICALS) ×5 IMPLANT
TROCAR XCEL NON-BLD 5MMX100MML (ENDOMECHANICALS) ×5 IMPLANT
TUBING ART PRESS 48 MALE/FEM (TUBING) IMPLANT
TUBING EVAC SMOKE HEATED PNEUM (TUBING) ×5 IMPLANT
WATER STERILE IRR 1000ML POUR (IV SOLUTION) ×3 IMPLANT

## 2019-07-14 NOTE — Transfer of Care (Signed)
Immediate Anesthesia Transfer of Care Note  Patient: Yvonne Nelson  Procedure(s) Performed: HYSTERECTOMY TOTAL LAPAROSCOPIC,BILATERAL SALPINGO OOPHERECTOMY (N/A ) LAPAROSCOPIC BILATERAL SALPINGO OOPHORECTOMY (Bilateral ) CYSTOSCOPY  Patient Location: PACU  Anesthesia Type:General  Level of Consciousness: sedated  Airway & Oxygen Therapy: Patient Spontanous Breathing and Patient connected to face mask oxygen  Post-op Assessment: Report given to RN and Post -op Vital signs reviewed and stable  Post vital signs: Reviewed and stable  Last Vitals:  Vitals Value Taken Time  BP 136/78 07/14/19 1101  Temp 36.3 C 07/14/19 1101  Pulse 88 07/14/19 1102  Resp 20 07/14/19 1102  SpO2 95 % 07/14/19 1102  Vitals shown include unvalidated device data.  Last Pain:  Vitals:   07/14/19 1101  TempSrc:   PainSc: 0-No pain         Complications: No apparent anesthesia complications

## 2019-07-14 NOTE — Interval H&P Note (Signed)
History and Physical Interval Note:  07/14/2019 7:29 AM  Yvonne Nelson  has presented today for surgery, with the diagnosis of complex ovarian cyst, acute on chronic pelvic pain,.  The various methods of treatment have been discussed with the patient and family. After consideration of risks, benefits and other options for treatment, the patient has consented to  Procedure(s): HYSTERECTOMY TOTAL LAPAROSCOPIC, (N/A) LAPAROSCOPIC BILATERAL SALPINGO OOPHORECTOMY (Bilateral) HYSTERECTOMY ABDOMINAL WITH SALPINGECTOMY, POSSIBLE PERITENEAL BIOPSY, POSSIBLE I&D OF PELVIC ABSCESS (Bilateral) as a surgical intervention.  The patient's history has been reviewed, patient examined, no change in status, stable for surgery.  I have reviewed the patient's chart and labs.  Questions were answered to the patient's satisfaction.     Benjaman Kindler
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
## 2019-07-14 NOTE — Op Note (Signed)
Yvonne Nelson PROCEDURE DATE: 07/14/2019  PREOPERATIVE DIAGNOSIS: Pelvic pain, hx of ruptured TOA, suspected hydrosalpinx POSTOPERATIVE DIAGNOSIS: The same PROCEDURE:  HYSTERECTOMY TOTAL LAPAROSCOPIC,BILATERAL SALPINGO OOPHERECTOMY:  LAPAROSCOPIC BILATERAL SALPINGO OOPHORECTOMY:  CYSTOSCOPY:   Laparoscopic lysis of adhesions  SURGEON:  Dr. Benjaman Kindler ASSISTANT: Dr. Laverta Baltimore  Anesthesiologist:  Anesthesiologist: Molli Barrows, MD CRNA: Hedda Slade, CRNA; Jonna Clark, CRNA  INDICATIONS: 49 y.o. here for definitive surgical management secondary to the indications listed under preoperative diagnoses; please see preoperative note for further details.  Risks of surgery were discussed with the patient including but not limited to: bleeding which may require transfusion or reoperation; infection which may require antibiotics; injury to bowel, bladder, ureters or other surrounding organs; need for additional procedures; thromboembolic phenomenon, incisional problems and other postoperative/anesthesia complications. Written informed consent was obtained.    FINDINGS:  Significant omental adhesions, R>L, encasing the right adnexa to the level of the uterine artery. Omental adhesions also encased the left adnexa and advanced into the posterior cul de sac, obscuring the cervix. Small normal uterus, bilateral tubes and ovaries, though the right tube was removed in its omental package and was difficult to evaluate. Normal appendix. Right and left liver visualized, with an enlarged and asymmetric gall bladder. Normal vagina.  Adhesiolysis comprised >50% of the time of this case.  ANESTHESIA:    General INTRAVENOUS FLUIDS:1000  ml ESTIMATED BLOOD LOSS:50 ml URINE OUTPUT: 500 ml  SPECIMENS: Uterus, cervix, bilateral fallopian tubes and bilateral ovaries COMPLICATIONS: None immediate  PROCEDURE IN DETAIL:  The patient received prophalactic intravenous antibiotics and had  sequential compression devices applied to her lower extremities while in the preoperative area.  She was then taken to the operating room where general anesthesia was administered and was found to be adequate.  She was placed in the dorsal lithotomy position, and was prepped and draped in a sterile manner.  A formal time out was performed with all team members present and in agreement.  A V-care uterine manipulator was placed at this time.  A Foley catheter was inserted into her bladder and attached to constant drainage. Attention was turned to the abdomen where an umbilical incision was made with the scalpel.  The Optiview 5-mm trocar and sleeve were then advanced without difficulty with the laparoscope under direct visualization into the abdomen.  The abdomen was then insufflated with carbon dioxide gas and adequate pneumoperitoneum was obtained.  A survey of the patient's pelvis and abdomen revealed the findings above.  Bilateral lower quadrant ports (5 mm on the right and 5 mm on the left) were then placed under direct visualization.  The pelvis was then carefully examined.   Omental adhesions obscured most of the field. These were taken down sharply and bluntly to restore normal anatomy. The operative field was visualized and cautery used to assure hemostasis.   The ureters were found bilaterally prior to starting the hysterectomy.   Attention was turned to the IP ligaments. These were fulgurated and ligated, freeing the ovaries from the pelvic sidewall. The broad ligament was transected and the anterior and posterior leaflets of the broad opened. The uterine artery was then skeletonized and a bladder flap was created.  The ureters were noted to be safely away from the area of dissection.  The bladder was then bluntly dissected off the lower uterine segment.    At this point, attention was turned to the uterine vessels, which were clamped and ligated using the Ligasure.  Good hemostasis was noted  overall.  The uterosacral and cardinal ligaments were clamped, cut and ligated bilaterally .  Attention was then turned to the cervicovaginal junction, and the bipolar scissors were used to transect the cervix from the surrounding vagina using the ring of the V-care as a guide. This was done circumferentially allowing total hysterectomy.  The uterus was then removed from the vagina and the vaginal cuff incision was then closed with running V-loc.  Overall excellent hemostasis was noted.    Attention was returned to the abdomen.The ureters were reexamined bilaterally and were pulsating normally. The abdominal pressure was reduced and hemostasis was confirmed.   Cystoscopy confirmed bilateral ureteral jets.  No stitches or lacerations were visualized in the bladder during cystoscopy.  The 74mm port fascia was closed with a vertical mattress with 0-Vicryl, using the cone closure system. All trocars were removed under direct visualization, and the abdomen was desufflated.  The fascial incision of the umbilicus was closed with a 0 Vicryl figure of eight stitch.  All skin incisions were closed with 4-0 Vicryl subcuticular stitches and Dermabond. The patient tolerated the procedures well.  All instruments, needles, and sponge counts were correct x 2. The patient was taken to the recovery room awake, extubated and in stable condition.

## 2019-07-14 NOTE — Progress Notes (Signed)
Patient able to void prior to discharge. Upon standing after voiding patient reported, ''what is that that just fell out of my body?'' Visualized golf ball size of unknown tissue, some blood noted in toilet however minimal bleeding noted on toilet paper. Patient able to ambulate to chair. Notified Dr. Leafy Ro who spoke with patient and visualized specimen, which was returned to the OR desk for collection via Dr. Leafy Ro. Patient discharge instructions reviewed at length and patient verbalized understanding. Patient PICC line was also discontinued, noted at 34 cm as previously charted and tip intact upon removal. Patient escorted from [facility] in no acute distress , to the care of family mother and husband.

## 2019-07-14 NOTE — Discharge Instructions (Signed)
AMBULATORY SURGERY  DISCHARGE INSTRUCTIONS   1) The drugs that you were given will stay in your system until tomorrow so for the next 24 hours you should not:  A) Drive an automobile B) Make any legal decisions C) Drink any alcoholic beverage   2) You may resume regular meals tomorrow.  Today it is better to start with liquids and gradually work up to solid foods.  You may eat anything you prefer, but it is better to start with liquids, then soup and crackers, and gradually work up to solid foods.   3) Please notify your doctor immediately if you have any unusual bleeding, trouble breathing, redness and pain at the surgery site, drainage, fever, or pain not relieved by medication. 4)   5) Your post-operative visit with Dr.                                     is: Date:                        Time:    Please call to schedule your post-operative visit.  6) Additional Instructions:     Discharge instructions after   total laparoscopic hysterectomy   For the next three days, take ibuprofen and acetaminophen on a schedule, every 8 hours. You can take them together or you can intersperse them, and take one every four hours. I also gave you gabapentin for nighttime, to help you sleep and also to control pain. Take gabapentin medicines at night for at least the next 3 nights. You also have a narcotic, oxycodone, to take as needed if the above medicines don't help.  Postop constipation is a major cause of pain. Stay well hydrated, walk as you tolerate, and take over the counter senna as well as stool softeners if you need them.    Signs and Symptoms to Report Call our office at [phone] if you have any of the following.  . Fever over 100.4 degrees or higher . Severe stomach pain not relieved with pain medications . Bright red bleeding that's heavier than a period that does not slow with rest . To go the bathroom a lot (frequency), you can't hold your urine (urgency), or it  hurts when you empty your bladder (urinate) . Chest pain . Shortness of breath . Pain in the calves of your legs . Severe nausea and vomiting not relieved with anti-nausea medications . Signs of infection around your wounds, such as redness, hot to touch, swelling, green/yellow drainage (like pus), bad smelling discharge . Any concerns  What You Can Expect after Surgery . You may see some pink tinged, bloody fluid and bruising around the wound. This is normal. . You may notice shoulder and neck pain. This is caused by the gas used during surgery to expand your abdomen so your surgeon could get to the uterus easier. . You may have a sore throat because of the tube in your mouth during general anesthesia. This will go away in 2 to 3 days. . You may have some stomach cramps. . You may notice spotting on your panties. . You may have pain around the incision sites.   Activities after Your Discharge Follow these guidelines to help speed your recovery at home: . Do the coughing and deep breathing as you did in the hospital for 2 weeks. Use the small blue breathing device,  called the incentive spirometer for 2 weeks. . Don't drive if you are in pain or taking narcotic pain medicine. You may drive when you can safely slam on the brakes, turn the wheel forcefully, and rotate your torso comfortably. This is typically 1-2 weeks. Practice in a parking lot or side street prior to attempting to drive regularly.  . Ask others to help with household chores for 4 weeks. . Do not lift anything heavier that 10 pounds for 4-6 weeks. This includes pets, children, and groceries. . Don't do strenuous activities, exercises, or sports like vacuuming, tennis, squash, etc. until your doctor says it is safe to do so. ---Maintain pelvic rest for 8 weeks. This means nothing in the vagina or rectum at all (no douching, tampons, intercourse) for 8 weeks.  . Walk as you feel able. Rest often since it may take two or three  weeks for your energy level to return to normal.  . You may climb stairs . Avoid constipation:   -Eat fruits, vegetables, and whole grains. Eat small meals as your appetite will take time to return to normal.   -Drink 6 to 8 glasses of water each day unless your doctor has told you to limit your fluids.   -Use a laxative or stool softener as needed if constipation becomes a problem. You may take Miralax, metamucil, Citrucil, Colace, Senekot, FiberCon, etc. If this does not relieve the constipation, try two tablespoons of Milk Of Magnesia every 8 hours until your bowels move.  . You may shower. Gently wash the wounds with a mild soap and water. Pat dry. . Do not get in a hot tub, swimming pool, etc. for 6 weeks. . Do not use lotions, oils, powders on the wounds. . Do not douche, use tampons, or have sex until your doctor says it is okay. . Take your pain medicine when you need it. The medicine may not work as well if the pain is bad.  Take the medicines you were taking before surgery. Other medications you will need are pain medications and possibly constipation and nausea medications (Zofran).

## 2019-07-14 NOTE — Anesthesia Procedure Notes (Signed)
Procedure Name: Intubation Date/Time: 07/14/2019 7:40 AM Performed by: Hedda Slade, CRNA Pre-anesthesia Checklist: Patient identified, Patient being monitored, Timeout performed, Emergency Drugs available and Suction available Patient Re-evaluated:Patient Re-evaluated prior to induction Oxygen Delivery Method: Circle system utilized Preoxygenation: Pre-oxygenation with 100% oxygen Induction Type: IV induction Ventilation: Mask ventilation without difficulty and Oral airway inserted - appropriate to patient size Laryngoscope Size: 3 and McGraph Grade View: Grade I Tube type: Oral Tube size: 7.0 mm Number of attempts: 1 Airway Equipment and Method: Stylet and Video-laryngoscopy Placement Confirmation: ETT inserted through vocal cords under direct vision,  positive ETCO2 and breath sounds checked- equal and bilateral Secured at: 21 cm Tube secured with: Tape Dental Injury: Teeth and Oropharynx as per pre-operative assessment  Difficulty Due To: Difficulty was anticipated and Difficult Airway- due to anterior larynx Future Recommendations: Recommend- induction with short-acting agent, and alternative techniques readily available

## 2019-07-14 NOTE — Anesthesia Postprocedure Evaluation (Signed)
Anesthesia Post Note  Patient: Yvonne Nelson  Procedure(s) Performed: HYSTERECTOMY TOTAL LAPAROSCOPIC,BILATERAL SALPINGO OOPHERECTOMY (N/A ) LAPAROSCOPIC BILATERAL SALPINGO OOPHORECTOMY (Bilateral ) CYSTOSCOPY  Patient location during evaluation: PACU Anesthesia Type: General Level of consciousness: awake and alert Pain management: pain level controlled Vital Signs Assessment: post-procedure vital signs reviewed and stable Respiratory status: spontaneous breathing, nonlabored ventilation, respiratory function stable and patient connected to nasal cannula oxygen Cardiovascular status: blood pressure returned to baseline and stable Postop Assessment: no apparent nausea or vomiting Anesthetic complications: no     Last Vitals:  Vitals:   07/14/19 0619 07/14/19 1101  BP: (!) 146/85 136/78  Pulse: 72 92  Resp: 14 (!) 21  Temp: (!) 36.3 C (!) 36.3 C  SpO2: 95% 97%    Last Pain:  Vitals:   07/14/19 1101  TempSrc:   PainSc: 0-No pain                 Molli Barrows

## 2019-07-21 LAB — SURGICAL PATHOLOGY

## 2021-02-23 ENCOUNTER — Telehealth (HOSPITAL_COMMUNITY): Payer: Self-pay | Admitting: Licensed Clinical Social Worker
